# Patient Record
Sex: Male | Born: 1965 | Race: White | Hispanic: No | Marital: Single | Smoking: Current some day smoker
Health system: Southern US, Community
[De-identification: ages and names within clinical notes are randomized; demographics above are authoritative.]

## PROBLEM LIST (undated history)

## (undated) ENCOUNTER — Ambulatory Visit

## (undated) DIAGNOSIS — F1490 Cocaine use, unspecified, uncomplicated: Secondary | ICD-10-CM

## (undated) DIAGNOSIS — I214 Non-ST elevation (NSTEMI) myocardial infarction: Secondary | ICD-10-CM

## (undated) DIAGNOSIS — I255 Ischemic cardiomyopathy: Secondary | ICD-10-CM

## (undated) DIAGNOSIS — Z720 Tobacco use: Secondary | ICD-10-CM

## (undated) DIAGNOSIS — F1491 Cocaine use, unspecified, in remission: Secondary | ICD-10-CM

## (undated) DIAGNOSIS — I2510 Atherosclerotic heart disease of native coronary artery without angina pectoris: Secondary | ICD-10-CM

## (undated) HISTORY — DX: Cocaine use, unspecified, in remission: F14.91

---

## 2020-07-08 ENCOUNTER — Inpatient Hospital Stay (HOSPITAL_COMMUNITY)
Admission: EM | Admit: 2020-07-08 | Discharge: 2020-07-12 | DRG: 247 | Disposition: A | Discharge status: Home / Self Care | Payer: BC Managed Care – PPO | Attending: Cardiovascular Disease | Admitting: Cardiovascular Disease

## 2020-07-08 ENCOUNTER — Emergency Department (HOSPITAL_COMMUNITY): Payer: BC Managed Care – PPO

## 2020-07-08 ENCOUNTER — Encounter (HOSPITAL_COMMUNITY): Payer: Self-pay

## 2020-07-08 ENCOUNTER — Other Ambulatory Visit: Payer: Self-pay

## 2020-07-08 DIAGNOSIS — F1410 Cocaine abuse, uncomplicated: Secondary | ICD-10-CM | POA: Diagnosis present

## 2020-07-08 DIAGNOSIS — Z955 Presence of coronary angioplasty implant and graft: Secondary | ICD-10-CM

## 2020-07-08 DIAGNOSIS — I214 Non-ST elevation (NSTEMI) myocardial infarction: Secondary | ICD-10-CM | POA: Diagnosis not present

## 2020-07-08 DIAGNOSIS — I255 Ischemic cardiomyopathy: Secondary | ICD-10-CM | POA: Diagnosis present

## 2020-07-08 DIAGNOSIS — F17210 Nicotine dependence, cigarettes, uncomplicated: Secondary | ICD-10-CM | POA: Diagnosis present

## 2020-07-08 DIAGNOSIS — Z20822 Contact with and (suspected) exposure to covid-19: Secondary | ICD-10-CM | POA: Diagnosis present

## 2020-07-08 DIAGNOSIS — R079 Chest pain, unspecified: Secondary | ICD-10-CM | POA: Diagnosis not present

## 2020-07-08 DIAGNOSIS — Z720 Tobacco use: Secondary | ICD-10-CM

## 2020-07-08 DIAGNOSIS — N179 Acute kidney failure, unspecified: Secondary | ICD-10-CM | POA: Diagnosis not present

## 2020-07-08 DIAGNOSIS — I2510 Atherosclerotic heart disease of native coronary artery without angina pectoris: Secondary | ICD-10-CM

## 2020-07-08 HISTORY — DX: Atherosclerotic heart disease of native coronary artery without angina pectoris: I25.10

## 2020-07-08 HISTORY — DX: Ischemic cardiomyopathy: I25.5

## 2020-07-08 HISTORY — DX: Cocaine use, unspecified, uncomplicated: F14.90

## 2020-07-08 HISTORY — DX: Non-ST elevation (NSTEMI) myocardial infarction: I21.4

## 2020-07-08 HISTORY — DX: Tobacco use: Z72.0

## 2020-07-08 MED ORDER — NITROGLYCERIN 2 % TD OINT
0.5000 [in_us] | TOPICAL_OINTMENT | Freq: Once | TRANSDERMAL | Status: AC
  Administered 2020-07-09: 0.5 [in_us] via TOPICAL
  Filled 2020-07-08: qty 1

## 2020-07-08 MED ORDER — KETOROLAC TROMETHAMINE 30 MG/ML IJ SOLN
15.0000 mg | Freq: Once | INTRAMUSCULAR | Status: AC
  Administered 2020-07-09: 15 mg via INTRAVENOUS
  Filled 2020-07-08: qty 1

## 2020-07-08 MED ORDER — ASPIRIN 325 MG PO TABS
325.0000 mg | ORAL_TABLET | Freq: Once | ORAL | Status: AC
  Administered 2020-07-09: 325 mg via ORAL
  Filled 2020-07-08: qty 1

## 2020-07-08 NOTE — ED Triage Notes (Signed)
Pt reports chest pain that started this afternoon, pt took a nap, woke up, and chest pain was still there, pt also reports pain in left arm. EKG performed in triage.

## 2020-07-08 NOTE — ED Provider Notes (Addendum)
[facility] EMERGENCY DEPARTMENT Provider Note   CSN: [id number] Arrival date & time: 07/08/20  2144   Time seen 11:44 PM  History Chief Complaint  Patient presents with  . Chest Pain    Billy Briggs is a 54 y.o. male.  HPI   Patient states around 3 PM he was laying on the couch watching a movie and he had acute onset of left-sided chest pain that he initially described as burning but then stated it was not a feeling of feeling hot or on fire but  he could not describe the pain. He took two ibuprofen and took a nap and when he woke up at 8:30 PM the pain was still there. He took two more ibuprofen and his pain has improved but still present. He states laying down makes the pain worse, sitting up makes it feel better. He states when the pain gets intense he gets short of breath but otherwise he is not short of breath. He states the pain has been constant but waxes and wanes. He had nausea and vomited once at 3 PM when the pain first started. He states it does radiate about halfway down his left arm but not into his back or neck. He states he had similar pain about a week ago that only lasted about an hour. He denies any change in his routine or work duties. He denies history of hypertension, diabetes, high cholesterol. He denies any family history of coronary artery disease. Patient does smoke about 1 pack/week. He drinks once or twice a week and states he drinks "a few beers". He states his pain was 10 out of 10 prior to coming to the ED but is now 7 out of 10 after taking ibuprofen.    PCP Patient, No Pcp Per   History reviewed. No pertinent past medical history.  Patient Active Problem List   Diagnosis Date Noted  . NSTEMI (non-ST elevated myocardial infarction) (HCC) 07/09/2020    History reviewed. No pertinent surgical history.     No family history on file.  Social History   Tobacco Use  . Smoking status: Current Some Day Smoker  . Smokeless tobacco: Never Used    Substance Use Topics  . Alcohol use: Yes    Comment: occ  . Drug use: Yes    Comment: crack occ  employed in factory  Home Medications Prior to Admission medications   Not on File  None  Allergies    Patient has no known allergies.  Review of Systems   Review of Systems  All other systems reviewed and are negative.   Physical Exam Updated Vital Signs BP 109/79   Pulse 62   Temp 97.7 F (36.5 C) (Oral)   Resp (!) 25   Ht 6\' 1"  (1.854 m)   Wt 88.5 kg   SpO2 100%   BMI 25.73 kg/m   Physical Exam Vitals and nursing note reviewed.  Constitutional:      General: He is in acute distress.     Appearance: Normal appearance. He is normal weight.  HENT:     Head: Normocephalic and atraumatic.  Eyes:     Extraocular Movements: Extraocular movements intact.     Conjunctiva/sclera: Conjunctivae normal.     Pupils: Pupils are equal, round, and reactive to light.  Cardiovascular:     Rate and Rhythm: Normal rate and regular rhythm.     Pulses: Normal pulses.     Heart sounds: Normal heart sounds. No murmur heard.  Pulmonary:     Effort: Pulmonary effort is normal. No respiratory distress.     Breath sounds: Normal breath sounds. No wheezing.  Chest:     Chest wall: No tenderness.       Comments: Area of chest pain noted Abdominal:     General: Abdomen is flat. Bowel sounds are normal.     Palpations: Abdomen is soft.     Tenderness: There is no abdominal tenderness.  Musculoskeletal:     Cervical back: Normal range of motion.  Skin:    General: Skin is warm and dry.  Neurological:     General: No focal deficit present.     Mental Status: He is alert and oriented to person, place, and time.     Cranial Nerves: No cranial nerve deficit.  Psychiatric:        Mood and Affect: Mood is anxious.        Speech: Speech is rapid and pressured.        Behavior: Behavior normal. Behavior is cooperative.        Thought Content: Thought content normal.     ED Results  / Procedures / Treatments   Labs (all labs ordered are listed, but only abnormal results are displayed) Results for orders placed or performed during the hospital encounter of 07/08/20  SARS Coronavirus 2 by RT PCR (hospital order, performed in [facility] hospital lab) Nasopharyngeal Nasopharyngeal Swab   Specimen: Nasopharyngeal Swab  Result Value Ref Range   SARS Coronavirus 2 NEGATIVE NEGATIVE  Basic metabolic panel  Result Value Ref Range   Sodium 137 135 - 145 mmol/L   Potassium 3.8 3.5 - 5.1 mmol/L   Chloride 107 98 - 111 mmol/L   CO2 19 (L) 22 - 32 mmol/L   Glucose, Bld 131 (H) 70 - 99 mg/dL   BUN 17 6 - 20 mg/dL   Creatinine, Ser 8.58 0.61 - 1.24 mg/dL   Calcium 9.2 8.9 - 85.0 mg/dL   GFR calc non Af Amer >60 >60 mL/min   GFR calc Af Amer >60 >60 mL/min   Anion gap 11 5 - 15  CBC  Result Value Ref Range   WBC 11.2 (H) 4.0 - 10.5 K/uL   RBC 5.91 (H) 4.22 - 5.81 MIL/uL   Hemoglobin 17.1 (H) 13.0 - 17.0 g/dL   HCT 27.7 39 - 52 %   MCV 86.0 80.0 - 100.0 fL   MCH 28.9 26.0 - 34.0 pg   MCHC 33.7 30.0 - 36.0 g/dL   RDW 41.2 87.8 - 67.6 %   Platelets 333 150 - 400 K/uL   nRBC 0.0 0.0 - 0.2 %  D-dimer, quantitative  Result Value Ref Range   D-Dimer, Quant <0.27 0.00 - 0.50 ug/mL-FEU  Hepatic function panel  Result Value Ref Range   Total Protein 6.9 6.5 - 8.1 g/dL   Albumin 4.0 3.5 - 5.0 g/dL   AST 42 (H) 15 - 41 U/L   ALT 27 0 - 44 U/L   Alkaline Phosphatase 85 38 - 126 U/L   Total Bilirubin 1.4 (H) 0.3 - 1.2 mg/dL   Bilirubin, Direct 0.2 0.0 - 0.2 mg/dL   Indirect Bilirubin 1.2 (H) 0.3 - 0.9 mg/dL  Lipase, blood  Result Value Ref Range   Lipase 19 11 - 51 U/L  Ethanol  Result Value Ref Range   Alcohol, Ethyl (B) <10 <10 mg/dL  Urine rapid drug screen (hosp performed)  Result Value Ref Range   Opiates  NONE DETECTED NONE DETECTED   Cocaine POSITIVE (A) NONE DETECTED   Benzodiazepines NONE DETECTED NONE DETECTED   Amphetamines NONE DETECTED NONE DETECTED    Tetrahydrocannabinol NONE DETECTED NONE DETECTED   Barbiturates NONE DETECTED NONE DETECTED  Troponin I (High Sensitivity)  Result Value Ref Range   Troponin I (High Sensitivity) 2,586 (HH) <18 ng/L  Troponin I (High Sensitivity)  Result Value Ref Range   Troponin I (High Sensitivity) 5,359 (HH) <18 ng/L   Laboratory interpretation all normal except positive initial troponin and the delta troponin increased by 3000 roughly.  Positive UDS, mild leukocytosis    EKG EKG Interpretation  Date/Time:  Saturday July 08 2020 21:52:57 EDT Ventricular Rate:  87 PR Interval:  136 QRS Duration: 84 QT Interval:  372 QTC Calculation: 447 R Axis:   87 Text Interpretation: Normal sinus rhythm with sinus arrhythmia Anteroseptal infarct , age undetermined Baseline wander No old tracing to compare Confirmed by Devoria AlbeKnapp, Tere Mcconaughey ([id number]) on 07/08/2020 11:06:58 PM   Radiology DG Chest 2 View  Result Date: 07/08/2020 CLINICAL DATA:  Chest pain EXAM: CHEST - 2 VIEW COMPARISON:  None. FINDINGS: The heart size and mediastinal contours are within normal limits. Both lungs are clear. The visualized skeletal structures are unremarkable. IMPRESSION: No active cardiopulmonary disease. Electronically Signed   By: Helyn NumbersAshesh  Parikh MD   On: 07/08/2020 22:30    Procedures .Critical Care Performed by: Devoria AlbeKnapp, Antjuan Rothe, MD Authorized by: Devoria AlbeKnapp, German Manke, MD   Critical care provider statement:    Critical care time (minutes):  40   Critical care was necessary to treat or prevent imminent or life-threatening deterioration of the following conditions:  Cardiac failure   Critical care was time spent personally by me on the following activities:  Discussions with consultants, examination of patient, obtaining history from patient or surrogate, ordering and review of laboratory studies, ordering and review of radiographic studies, re-evaluation of patient's condition and review of old charts   (including critical care  time)  Medications Ordered in ED Medications  heparin bolus via infusion 4,000 Units (has no administration in time range)  heparin ADULT infusion 100 units/mL (25000 units/28350mL sodium chloride 0.45%) (has no administration in time range)  nitroGLYCERIN 50 mg in dextrose 5 % 250 mL (0.2 mg/mL) infusion (has no administration in time range)  sodium chloride 0.9 % bolus 500 mL (has no administration in time range)  aspirin tablet 325 mg (325 mg Oral Given 07/09/20 0030)  nitroGLYCERIN (NITROGLYN) 2 % ointment 0.5 inch (0.5 inches Topical Given 07/09/20 0030)  ketorolac (TORADOL) 30 MG/ML injection 15 mg (15 mg Intravenous Given 07/09/20 0029)  fentaNYL (SUBLIMAZE) injection 50 mcg (50 mcg Intravenous Given 07/09/20 0105)    ED Course  I have reviewed the triage vital signs and the nursing notes.  Pertinent labs & imaging results that were available during my care of the patient were reviewed by me and considered in my medical decision making (see chart for details).    MDM Rules/Calculators/A&P                           Patient was given aspirin 325 mg, he was given 1/2 inch of nitroglycerin paste because his blood pressure was borderline at 109 systolic.  He also was given ketorolac 15 mg IV since ibuprofen had helped his pain before.  Recheck at 12:40 AM patient's initial troponin is very elevated.  On reviewing his chart he has a history of cocaine  abuse.  When asked patient he states the last time was 3 days ago.  He states he did not do any today.  He states his pain now is a 3-4 out of 10 and is much better.  He was given fentanyl for pain, he was also started on heparin bolus and drip per pharmacy.  I am waiting for cardiology to call me back.  1:07 AM patient discussed with Dr. Mayford Knife, cardiology.  She agrees with heparin drip and putting patient on nitroglycerin drip, I will have nurses remove the nitroglycerin paste.  She is reviewed his EKG and agrees he is not a STEMI.  She has  excepted him for admission at [facility] to telemetry bed when available.  Dr. Elease Hashimoto will be the admitting doctor.  He can have morphine as needed for pain.  She also agrees with the UDS.  Recheck at 3:40 AM patient states his pain is gone and is a 0.  His second troponin is about 3000 units higher.  I asked him to let the nurse know immediately if his pain returns or he gets worse.  5:20 AM patient is transferred to [facility] for admission to cardiology.  Final Clinical Impression(s) / ED Diagnoses Final diagnoses:  NSTEMI (non-ST elevated myocardial infarction) [facility])    Rx / DC Orders  Plan admission  Devoria Albe, MD, Concha Pyo, MD 07/09/20 [id number]    Devoria Albe, MD 07/09/20 [phone]

## 2020-07-09 ENCOUNTER — Encounter (HOSPITAL_COMMUNITY)
Admission: EM | Disposition: A | Discharge status: Home / Self Care | Payer: Self-pay | Source: Home / Self Care | Attending: Cardiovascular Disease

## 2020-07-09 ENCOUNTER — Other Ambulatory Visit: Payer: Self-pay

## 2020-07-09 ENCOUNTER — Encounter (HOSPITAL_COMMUNITY): Payer: Self-pay | Admitting: Cardiovascular Disease

## 2020-07-09 ENCOUNTER — Observation Stay (HOSPITAL_COMMUNITY): Payer: BC Managed Care – PPO

## 2020-07-09 DIAGNOSIS — N183 Chronic kidney disease, stage 3 unspecified: Secondary | ICD-10-CM | POA: Diagnosis not present

## 2020-07-09 DIAGNOSIS — Z720 Tobacco use: Secondary | ICD-10-CM | POA: Diagnosis not present

## 2020-07-09 DIAGNOSIS — I214 Non-ST elevation (NSTEMI) myocardial infarction: Secondary | ICD-10-CM | POA: Diagnosis present

## 2020-07-09 DIAGNOSIS — R079 Chest pain, unspecified: Secondary | ICD-10-CM

## 2020-07-09 DIAGNOSIS — F17210 Nicotine dependence, cigarettes, uncomplicated: Secondary | ICD-10-CM | POA: Diagnosis present

## 2020-07-09 DIAGNOSIS — R778 Other specified abnormalities of plasma proteins: Secondary | ICD-10-CM

## 2020-07-09 DIAGNOSIS — F1410 Cocaine abuse, uncomplicated: Secondary | ICD-10-CM | POA: Diagnosis present

## 2020-07-09 DIAGNOSIS — I255 Ischemic cardiomyopathy: Secondary | ICD-10-CM | POA: Diagnosis present

## 2020-07-09 DIAGNOSIS — N289 Disorder of kidney and ureter, unspecified: Secondary | ICD-10-CM | POA: Diagnosis not present

## 2020-07-09 DIAGNOSIS — N179 Acute kidney failure, unspecified: Secondary | ICD-10-CM | POA: Diagnosis not present

## 2020-07-09 DIAGNOSIS — Z20822 Contact with and (suspected) exposure to covid-19: Secondary | ICD-10-CM | POA: Diagnosis present

## 2020-07-09 DIAGNOSIS — I2510 Atherosclerotic heart disease of native coronary artery without angina pectoris: Secondary | ICD-10-CM | POA: Diagnosis present

## 2020-07-09 HISTORY — PX: LEFT HEART CATH AND CORONARY ANGIOGRAPHY: CATH118249

## 2020-07-09 LAB — HEPARIN LEVEL (UNFRACTIONATED): Heparin Unfractionated: 0.4 IU/mL (ref 0.30–0.70)

## 2020-07-09 LAB — CBC
HCT: 41.7 % (ref 39.0–52.0)
HCT: 50.8 % (ref 39.0–52.0)
Hemoglobin: 13.6 g/dL (ref 13.0–17.0)
Hemoglobin: 17.1 g/dL — ABNORMAL HIGH (ref 13.0–17.0)
MCH: 28.2 pg (ref 26.0–34.0)
MCH: 28.9 pg (ref 26.0–34.0)
MCHC: 32.6 g/dL (ref 30.0–36.0)
MCHC: 33.7 g/dL (ref 30.0–36.0)
MCV: 86 fL (ref 80.0–100.0)
MCV: 86.3 fL (ref 80.0–100.0)
Platelets: 255 10*3/uL (ref 150–400)
Platelets: 333 10*3/uL (ref 150–400)
RBC: 4.83 MIL/uL (ref 4.22–5.81)
RBC: 5.91 MIL/uL — ABNORMAL HIGH (ref 4.22–5.81)
RDW: 13.8 % (ref 11.5–15.5)
RDW: 13.9 % (ref 11.5–15.5)
WBC: 11.2 10*3/uL — ABNORMAL HIGH (ref 4.0–10.5)
WBC: 6.8 10*3/uL (ref 4.0–10.5)
nRBC: 0 % (ref 0.0–0.2)
nRBC: 0 % (ref 0.0–0.2)

## 2020-07-09 LAB — LIPID PANEL
Cholesterol: 117 mg/dL (ref 0–200)
HDL: 44 mg/dL (ref >40)
LDL Cholesterol: 53 mg/dL (ref 0–99)
Total CHOL/HDL Ratio: 2.7 RATIO
Triglycerides: 99 mg/dL (ref <150)
VLDL: 20 mg/dL (ref 0–40)

## 2020-07-09 LAB — BASIC METABOLIC PANEL
Anion gap: 11 (ref 5–15)
BUN: 17 mg/dL (ref 6–20)
CO2: 19 mmol/L — ABNORMAL LOW (ref 22–32)
Calcium: 9.2 mg/dL (ref 8.9–10.3)
Chloride: 107 mmol/L (ref 98–111)
Creatinine, Ser: 1.14 mg/dL (ref 0.61–1.24)
GFR calc Af Amer: >60 mL/min (ref >60)
GFR calc non Af Amer: >60 mL/min (ref >60)
Glucose, Bld: 131 mg/dL — ABNORMAL HIGH (ref 70–99)
Potassium: 3.8 mmol/L (ref 3.5–5.1)
Sodium: 137 mmol/L (ref 135–145)

## 2020-07-09 LAB — RAPID URINE DRUG SCREEN, HOSP PERFORMED
Amphetamines: NOT DETECTED
Barbiturates: NOT DETECTED
Benzodiazepines: NOT DETECTED
Cocaine: POSITIVE — AB
Opiates: NOT DETECTED
Tetrahydrocannabinol: NOT DETECTED

## 2020-07-09 LAB — SEDIMENTATION RATE: Sed Rate: 0 mm/hr (ref 0–16)

## 2020-07-09 LAB — D-DIMER, QUANTITATIVE: D-Dimer, Quant: <0.27 ug/mL-FEU (ref 0.00–0.50)

## 2020-07-09 LAB — HEMOGLOBIN A1C
Hgb A1c MFr Bld: 5.4 % (ref 4.8–5.6)
Mean Plasma Glucose: 108.28 mg/dL

## 2020-07-09 LAB — ETHANOL: Alcohol, Ethyl (B): <10 mg/dL (ref <10)

## 2020-07-09 LAB — ECHOCARDIOGRAM COMPLETE
Area-P 1/2: 2.76 cm2
Height: 73 in
S' Lateral: 4.1 cm
Weight: 3104.08 oz

## 2020-07-09 LAB — HEPATIC FUNCTION PANEL
ALT: 27 U/L (ref 0–44)
AST: 42 U/L — ABNORMAL HIGH (ref 15–41)
Albumin: 4 g/dL (ref 3.5–5.0)
Alkaline Phosphatase: 85 U/L (ref 38–126)
Bilirubin, Direct: 0.2 mg/dL (ref 0.0–0.2)
Indirect Bilirubin: 1.2 mg/dL — ABNORMAL HIGH (ref 0.3–0.9)
Total Bilirubin: 1.4 mg/dL — ABNORMAL HIGH (ref 0.3–1.2)
Total Protein: 6.9 g/dL (ref 6.5–8.1)

## 2020-07-09 LAB — TROPONIN I (HIGH SENSITIVITY)
Troponin I (High Sensitivity): 14964 ng/L (ref <18)
Troponin I (High Sensitivity): 2586 ng/L (ref <18)
Troponin I (High Sensitivity): 5359 ng/L (ref <18)

## 2020-07-09 LAB — LIPASE, BLOOD: Lipase: 19 U/L (ref 11–51)

## 2020-07-09 LAB — TSH: TSH: 1.844 u[IU]/mL (ref 0.350–4.500)

## 2020-07-09 LAB — MRSA PCR SCREENING: MRSA by PCR: NEGATIVE

## 2020-07-09 LAB — HIV ANTIBODY (ROUTINE TESTING W REFLEX): HIV Screen 4th Generation wRfx: NONREACTIVE

## 2020-07-09 LAB — SARS CORONAVIRUS 2 BY RT PCR (HOSPITAL ORDER, PERFORMED IN ~~LOC~~ HOSPITAL LAB): SARS Coronavirus 2: NEGATIVE

## 2020-07-09 SURGERY — LEFT HEART CATH AND CORONARY ANGIOGRAPHY
Anesthesia: LOCAL

## 2020-07-09 MED ORDER — NITROGLYCERIN 0.4 MG SL SUBL: 0.4000 mg | SUBLINGUAL_TABLET | SUBLINGUAL | Status: DC | PRN

## 2020-07-09 MED ORDER — NITROGLYCERIN IN D5W 200-5 MCG/ML-% IV SOLN: 0.0000 ug/min | INTRAVENOUS | Status: DC

## 2020-07-09 MED ORDER — VERAPAMIL HCL 2.5 MG/ML IV SOLN
INTRAVENOUS | Status: AC
  Filled 2020-07-09: qty 2

## 2020-07-09 MED ORDER — HEPARIN SODIUM (PORCINE) 1000 UNIT/ML IJ SOLN
INTRAMUSCULAR | Status: AC
  Filled 2020-07-09: qty 1

## 2020-07-09 MED ORDER — SODIUM CHLORIDE 0.9% FLUSH
3.0000 mL | Freq: Two times a day (BID) | INTRAVENOUS | Status: DC
  Administered 2020-07-09 – 2020-07-10 (×2): 3 mL via INTRAVENOUS

## 2020-07-09 MED ORDER — DIAZEPAM 5 MG PO TABS
5.0000 mg | ORAL_TABLET | ORAL | Status: DC | PRN
  Administered 2020-07-10: 5 mg via ORAL
  Filled 2020-07-09: qty 1

## 2020-07-09 MED ORDER — ONDANSETRON HCL 4 MG/2ML IJ SOLN: 4.0000 mg | Freq: Four times a day (QID) | INTRAMUSCULAR | Status: DC | PRN

## 2020-07-09 MED ORDER — ARGATROBAN 50 MG/50ML IV SOLN
0.5000 ug/kg/min | INTRAVENOUS | Status: DC
  Administered 2020-07-09: 0.5 ug/kg/min via INTRAVENOUS
  Filled 2020-07-09: qty 50

## 2020-07-09 MED ORDER — IOHEXOL 350 MG/ML SOLN
INTRAVENOUS | Status: DC | PRN
  Administered 2020-07-09: 60 mL via INTRA_ARTERIAL

## 2020-07-09 MED ORDER — ACETAMINOPHEN 325 MG PO TABS: 650.0000 mg | ORAL_TABLET | ORAL | Status: DC | PRN

## 2020-07-09 MED ORDER — SODIUM CHLORIDE 0.9 % WEIGHT BASED INFUSION
1.0000 mL/kg/h | INTRAVENOUS | Status: AC
  Administered 2020-07-09 (×2): 1 mL/kg/h via INTRAVENOUS

## 2020-07-09 MED ORDER — HEPARIN BOLUS VIA INFUSION
4000.0000 [IU] | Freq: Once | INTRAVENOUS | Status: AC
  Administered 2020-07-09: 4000 [IU] via INTRAVENOUS

## 2020-07-09 MED ORDER — HEPARIN (PORCINE) 25000 UT/250ML-% IV SOLN
1450.0000 [IU]/h | INTRAVENOUS | Status: DC
  Administered 2020-07-09: 900 [IU]/h via INTRAVENOUS
  Administered 2020-07-10: 1300 [IU]/h via INTRAVENOUS
  Administered 2020-07-11: 1450 [IU]/h via INTRAVENOUS
  Filled 2020-07-09 (×3): qty 250

## 2020-07-09 MED ORDER — SODIUM CHLORIDE 0.9% FLUSH
3.0000 mL | Freq: Two times a day (BID) | INTRAVENOUS | Status: DC
  Administered 2020-07-09: 3 mL via INTRAVENOUS

## 2020-07-09 MED ORDER — ATORVASTATIN CALCIUM 80 MG PO TABS
80.0000 mg | ORAL_TABLET | Freq: Every day | ORAL | Status: DC
  Administered 2020-07-09 – 2020-07-10 (×2): 80 mg via ORAL
  Filled 2020-07-09 (×2): qty 1

## 2020-07-09 MED ORDER — CHLORHEXIDINE GLUCONATE CLOTH 2 % EX PADS
6.0000 | MEDICATED_PAD | Freq: Every day | CUTANEOUS | Status: DC
  Administered 2020-07-09 – 2020-07-11 (×3): 6 via TOPICAL

## 2020-07-09 MED ORDER — SODIUM CHLORIDE 0.9 % IV BOLUS
500.0000 mL | Freq: Once | INTRAVENOUS | Status: AC
  Administered 2020-07-09: 500 mL via INTRAVENOUS

## 2020-07-09 MED ORDER — SODIUM CHLORIDE 0.9 % IV SOLN: 250.0000 mL | INTRAVENOUS | Status: DC | PRN

## 2020-07-09 MED ORDER — ASPIRIN 81 MG PO CHEW: 81.0000 mg | CHEWABLE_TABLET | ORAL | Status: DC

## 2020-07-09 MED ORDER — NITROGLYCERIN 2 % TD OINT
1.0000 [in_us] | TOPICAL_OINTMENT | Freq: Four times a day (QID) | TRANSDERMAL | Status: DC
  Filled 2020-07-09: qty 30

## 2020-07-09 MED ORDER — SODIUM CHLORIDE 0.9 % WEIGHT BASED INFUSION
3.0000 mL/kg/h | INTRAVENOUS | Status: DC
  Administered 2020-07-09: 3 mL/kg/h via INTRAVENOUS

## 2020-07-09 MED ORDER — TIROFIBAN HCL IN NACL 5-0.9 MG/100ML-% IV SOLN
INTRAVENOUS | Status: DC | PRN
  Administered 2020-07-09: 0.15 ug/kg/min via INTRAVENOUS

## 2020-07-09 MED ORDER — TIROFIBAN (AGGRASTAT) BOLUS VIA INFUSION
INTRAVENOUS | Status: DC | PRN
  Administered 2020-07-09: 2212.5 ug via INTRAVENOUS

## 2020-07-09 MED ORDER — HEPARIN (PORCINE) 25000 UT/250ML-% IV SOLN
1200.0000 [IU]/h | INTRAVENOUS | Status: DC
  Administered 2020-07-09: 1200 [IU]/h via INTRAVENOUS
  Filled 2020-07-09: qty 250

## 2020-07-09 MED ORDER — SODIUM CHLORIDE 0.9% FLUSH: 3.0000 mL | INTRAVENOUS | Status: DC | PRN

## 2020-07-09 MED ORDER — NITROGLYCERIN IN D5W 200-5 MCG/ML-% IV SOLN
5.0000 ug/min | INTRAVENOUS | Status: DC
  Filled 2020-07-09: qty 250

## 2020-07-09 MED ORDER — VERAPAMIL HCL 2.5 MG/ML IV SOLN
INTRAVENOUS | Status: DC | PRN
  Administered 2020-07-09: 10 mL via INTRA_ARTERIAL

## 2020-07-09 MED ORDER — ATORVASTATIN CALCIUM 80 MG PO TABS: 80.0000 mg | ORAL_TABLET | Freq: Every day | ORAL | Status: DC

## 2020-07-09 MED ORDER — HEPARIN SODIUM (PORCINE) 1000 UNIT/ML IJ SOLN
INTRAMUSCULAR | Status: DC | PRN
  Administered 2020-07-09: 4500 [IU] via INTRAVENOUS

## 2020-07-09 MED ORDER — FENTANYL CITRATE (PF) 100 MCG/2ML IJ SOLN
50.0000 ug | Freq: Once | INTRAMUSCULAR | Status: AC
  Administered 2020-07-09: 50 ug via INTRAVENOUS
  Filled 2020-07-09: qty 2

## 2020-07-09 MED ORDER — HYDRALAZINE HCL 20 MG/ML IJ SOLN: 10.0000 mg | INTRAMUSCULAR | Status: AC | PRN

## 2020-07-09 MED ORDER — MIDAZOLAM HCL 2 MG/2ML IJ SOLN
INTRAMUSCULAR | Status: DC | PRN
  Administered 2020-07-09: 1 mg via INTRAVENOUS

## 2020-07-09 MED ORDER — ASPIRIN 81 MG PO CHEW: 324.0000 mg | CHEWABLE_TABLET | ORAL | Status: AC

## 2020-07-09 MED ORDER — IOHEXOL 350 MG/ML SOLN
INTRAVENOUS | Status: AC
  Filled 2020-07-09: qty 1

## 2020-07-09 MED ORDER — TIROFIBAN HCL IN NACL 5-0.9 MG/100ML-% IV SOLN
0.1500 ug/kg/min | INTRAVENOUS | Status: DC
  Administered 2020-07-09 – 2020-07-11 (×7): 0.15 ug/kg/min via INTRAVENOUS
  Filled 2020-07-09 (×5): qty 100
  Filled 2020-07-09: qty 200
  Filled 2020-07-09: qty 100

## 2020-07-09 MED ORDER — MIDAZOLAM HCL 2 MG/2ML IJ SOLN
INTRAMUSCULAR | Status: AC
  Filled 2020-07-09: qty 2

## 2020-07-09 MED ORDER — ASPIRIN EC 81 MG PO TBEC: 81.0000 mg | DELAYED_RELEASE_TABLET | Freq: Every day | ORAL | Status: DC

## 2020-07-09 MED ORDER — TIROFIBAN HCL IN NACL 5-0.9 MG/100ML-% IV SOLN
INTRAVENOUS | Status: AC
  Filled 2020-07-09: qty 100

## 2020-07-09 MED ORDER — ASPIRIN 300 MG RE SUPP: 300.0000 mg | RECTAL | Status: AC

## 2020-07-09 MED ORDER — PERFLUTREN LIPID MICROSPHERE
INTRAVENOUS | Status: AC
  Filled 2020-07-09: qty 10

## 2020-07-09 MED ORDER — SODIUM CHLORIDE 0.9 % WEIGHT BASED INFUSION: 1.0000 mL/kg/h | INTRAVENOUS | Status: DC

## 2020-07-09 MED ORDER — HEPARIN (PORCINE) IN NACL 1000-0.9 UT/500ML-% IV SOLN
INTRAVENOUS | Status: AC
  Filled 2020-07-09: qty 1000

## 2020-07-09 MED ORDER — FENTANYL CITRATE (PF) 100 MCG/2ML IJ SOLN
INTRAMUSCULAR | Status: DC | PRN
  Administered 2020-07-09: 25 ug via INTRAVENOUS

## 2020-07-09 MED ORDER — LIDOCAINE HCL (PF) 1 % IJ SOLN
INTRAMUSCULAR | Status: AC
  Filled 2020-07-09: qty 30

## 2020-07-09 MED ORDER — PERFLUTREN LIPID MICROSPHERE
1.0000 mL | INTRAVENOUS | Status: AC | PRN
  Administered 2020-07-09: 4 mL via INTRAVENOUS
  Filled 2020-07-09: qty 10

## 2020-07-09 MED ORDER — LABETALOL HCL 5 MG/ML IV SOLN: 10.0000 mg | INTRAVENOUS | Status: AC | PRN

## 2020-07-09 MED ORDER — LIDOCAINE HCL (PF) 1 % IJ SOLN
INTRAMUSCULAR | Status: DC | PRN
  Administered 2020-07-09: 5 mL via SUBCUTANEOUS

## 2020-07-09 MED ORDER — ASPIRIN 81 MG PO CHEW
81.0000 mg | CHEWABLE_TABLET | Freq: Every day | ORAL | Status: DC
  Administered 2020-07-10: 81 mg via ORAL
  Filled 2020-07-09: qty 1

## 2020-07-09 MED ORDER — FENTANYL CITRATE (PF) 100 MCG/2ML IJ SOLN
INTRAMUSCULAR | Status: AC
  Filled 2020-07-09: qty 2

## 2020-07-09 SURGICAL SUPPLY — 13 items
CATH INFINITI JR4 5F (CATHETERS) ×1 IMPLANT
CATH OPTITORQUE TIG 4.0 5F (CATHETERS) ×1 IMPLANT
DEVICE RAD COMP TR BAND LRG (VASCULAR PRODUCTS) ×1 IMPLANT
ELECT DEFIB PAD ADLT CADENCE (PAD) ×1 IMPLANT
GLIDESHEATH SLEND SS 6F .021 (SHEATH) ×1 IMPLANT
GUIDEWIRE INQWIRE 1.5J.035X260 (WIRE) IMPLANT
INQWIRE 1.5J .035X260CM (WIRE) ×2
KIT HEART LEFT (KITS) ×2 IMPLANT
PACK CARDIAC CATHETERIZATION (CUSTOM PROCEDURE TRAY) ×2 IMPLANT
SHEATH PROBE COVER 6X72 (BAG) ×1 IMPLANT
SYR MEDRAD MARK 7 150ML (SYRINGE) ×2 IMPLANT
TRANSDUCER W/STOPCOCK (MISCELLANEOUS) ×2 IMPLANT
TUBING CIL FLEX 10 FLL-RA (TUBING) ×2 IMPLANT

## 2020-07-09 NOTE — Interval H&P Note (Signed)
Cath Lab Visit (complete for each Cath Lab visit)  Clinical Evaluation Leading to the Procedure:   ACS: Yes.    Non-ACS:    Anginal Classification: CCS IV  Anti-ischemic medical therapy: Minimal Therapy (1 class of medications)  Non-Invasive Test Results: No non-invasive testing performed  Prior CABG: No previous CABG      History and Physical Interval Note:  07/09/2020 11:08 AM  Billy Briggs  has presented today for surgery, with the diagnosis of STEMI.  The various methods of treatment have been discussed with the patient and family. After consideration of risks, benefits and other options for treatment, the patient has consented to  Procedure(s): LEFT HEART CATH AND CORONARY ANGIOGRAPHY (N/A) as a surgical intervention.  The patient's history has been reviewed, patient examined, no change in status, stable for surgery.  I have reviewed the patient's chart and labs.  Questions were answered to the patient's satisfaction.     Nicki Guadalajara

## 2020-07-09 NOTE — Progress Notes (Signed)
°  Echocardiogram 2D Echocardiogram was attempted but the patient is going to the cath lab. We will try again later today.   Billy Briggs 07/09/2020, 8:01 AM

## 2020-07-09 NOTE — Progress Notes (Signed)
ANTICOAGULATION CONSULT NOTE - Initial Consult  Pharmacy Consult for heparin Indication: chest pain/ACS  No Known Allergies  Patient Measurements: Height: 6\' 1"  (185.4 cm) Weight: 88.5 kg (195 lb) IBW/kg (Calculated) : 79.9 Heparin Dosing Weight: 88.5 kg  Vital Signs: Temp: 97.7 F (36.5 C) (09/04 2158) Temp Source: Oral (09/04 2158) BP: 109/79 (09/04 2339) Pulse Rate: 62 (09/04 2339)  Labs: Recent Labs    07/08/20 2347  HGB 17.1*  HCT 50.8  PLT 333  CREATININE 1.14  TROPONINIHS 2,586*    Estimated Creatinine Clearance: 83.7 mL/min (by C-G formula based on SCr of 1.14 mg/dL).   Medical History: History reviewed. No pertinent past medical history.  Medications:  See medication history  Assessment: 54 yo man to start heparin for CP.  Hg 17.1, PTLC 333.  He was n ot on anticoagulation PTA Goal of Therapy:  Heparin level 0.3-0.7 units/ml Monitor platelets by anticoagulation protocol: Yes   Plan:  Heparin 4000 unit bolus and drip at 1200 units/hr Check heparin level 6 hours after start Daily HL and CBC while on heparin Monitor for bleeding complications  Billy Briggs 07/09/2020,1:07 AM

## 2020-07-09 NOTE — Progress Notes (Signed)
   Cardiac catheterization was discussed with the patient fully. The patient understands that risks include but are not limited to stroke (1 in 1000), death (1 in 1000), kidney failure [usually temporary] (1 in 500), bleeding (1 in 200), allergic reaction [possibly serious] (1 in 200).  The patient understands and is willing to proceed.     Ailyne Pawley, PA-C 07/09/2020 8:33 AM   

## 2020-07-09 NOTE — Progress Notes (Signed)
ANTICOAGULATION CONSULT NOTE  Pharmacy Consult for IV heparin Indication: chest pain/ACS  No Known Allergies  Patient Measurements: Height: 6\' 1"  (185.4 cm) Weight: 88.5 kg (195 lb) IBW/kg (Calculated) : 79.9 Heparin Dosing Weight: 88.5  Vital Signs: Temp: 97.9 F (36.6 C) (09/05 0752) Temp Source: Oral (09/05 0752) BP: 109/72 (09/05 0752) Pulse Rate: 70 (09/05 0752)  Labs: Recent Labs    07/08/20 2347 07/09/20 0125 07/09/20 0751  HGB 17.1*  --   --   HCT 50.8  --   --   PLT 333  --   --   HEPARINUNFRC  --   --  0.40  CREATININE 1.14  --   --   TROPONINIHS 2,586* 5,359*  --     Estimated Creatinine Clearance: 83.7 mL/min (by C-G formula based on SCr of 1.14 mg/dL).   Medications:  Scheduled:  . aspirin  324 mg Oral NOW   Or  . aspirin  300 mg Rectal NOW  . aspirin  81 mg Oral Pre-Cath  . [START ON 07/10/2020] aspirin EC  81 mg Oral Daily  . atorvastatin  80 mg Oral q1800  . nitroGLYCERIN  1 inch Topical Q6H  . sodium chloride flush  3 mL Intravenous Q12H    Assessment: 54 year old male with no PMH other than hx of tobacco use admitted for NSTEMI. Pharmacy consulted to dose IV heparin.  No anticoagulants PTA.  Heparin level 6 hours after starting heparin is therapeutic. Baseline CBC wnl. No reported bleeding.  Goal of Therapy:  Heparin level 0.3-0.7 units/ml Monitor platelets by anticoagulation protocol: Yes   Plan:  Continue IV heparin 1200 units/hr Check 6 hour confirmatory heparin level Check daily heparin level and CBC Monitor for signs and symptoms of bleeding.  57, PharmD PGY-1 Pharmacy Resident 07/09/2020 8:33 AM Please see AMION for all pharmacy numbers

## 2020-07-09 NOTE — H&P (Addendum)
Cardiology Admission History and Physical:   Patient ID: Billy Briggs MRN: [id number]; DOB: [birth year]   Admission date: 07/08/2020  Primary Care Provider: Patient, No Pcp Per Primary Cardiologist: None (NEW) Primary Electrophysiologist:  None   Chief Complaint:  NSTEMI  Patient Profile:   Billy Briggs is a 54 y.o. male with no prior medical hx presented to [facility] ER with complaints of acute onset of left sided Chest burning around 3pm and presented to ER around 9:30pm after taking a nap and waking back up with CP.  History of Present Illness:   [name] is a 54yo male with no PMH but a hx of tobacco use who was in his USOH until 3pm yesterday when he developed acute left sided chest burning while laying on the couch watching a movie.  The pain radiated into his left arm.  He took 2 Ibuprofen and took a nap and when he woke up at 8:30pm it was still there and he took 2 more Ibuprofen and his pain improved.  The pain is worse in the supine position and sitting up makes it feel better.  He does have some DOE when the CP gets bad.  It has been constant for 15 hours with waxing and waning.  He did get sick to his stomach when it first started and vomited.  He had a similar episode of CP about a week ago but only lasted an hour.  He has no fm hx of CAD and no other medical hx.  He smokes cigarettes 1 pack per week and has occasional beer.  In ER hsTrop was elevated at [phone].  WBC elevated at 11.2, DDimer normal, SCr 1.14.  COVID 19 neg.  He is now transferred to [facility] for further evaluation.  Currently he is pain free.  History reviewed. No pertinent past medical history.  History reviewed. No pertinent surgical history.   Medications Prior to Admission: Prior to Admission medications   Not on File     Allergies:   No Known Allergies  Social History:   Social History   Socioeconomic History  . Marital status: Single    Spouse name: Not on file  . Number of children: Not on file    . Years of education: Not on file  . Highest education level: Not on file  Occupational History  . Not on file  Tobacco Use  . Smoking status: Current Some Day Smoker  . Smokeless tobacco: Never Used  Substance and Sexual Activity  . Alcohol use: Yes    Comment: occ  . Drug use: Yes    Comment: crack occ  . Sexual activity: Not on file  Other Topics Concern  . Not on file  Social History Narrative  . Not on file   Social Determinants of Health   Financial Resource Strain:   . Difficulty of Paying Living Expenses: Not on file  Food Insecurity:   . Worried About Programme researcher, broadcasting/film/video in the Last Year: Not on file  . Ran Out of Food in the Last Year: Not on file  Transportation Needs:   . Lack of Transportation (Medical): Not on file  . Lack of Transportation (Non-Medical): Not on file  Physical Activity:   . Days of Exercise per Week: Not on file  . Minutes of Exercise per Session: Not on file  Stress:   . Feeling of Stress : Not on file  Social Connections:   . Frequency of Communication with Friends and Family: Not on  file  . Frequency of Social Gatherings with Friends and Family: Not on file  . Attends Religious Services: Not on file  . Active Member of Clubs or Organizations: Not on file  . Attends Banker Meetings: Not on file  . Marital Status: Not on file  Intimate Partner Violence:   . Fear of Current or Ex-Partner: Not on file  . Emotionally Abused: Not on file  . Physically Abused: Not on file  . Sexually Abused: Not on file    Family History:   The patient's family history was reviewed and no hx of CAD, CHF  ROS:  Please see the history of present illness.  All other ROS reviewed and negative.     Physical Exam/Data:   Vitals:   07/09/20 0300 07/09/20 0330 07/09/20 0430 07/09/20 0603  BP: 109/75 105/74 105/76 100/70  Pulse:   64 69  Resp: (!) 25 (!) 22 (!) 25 16  Temp:    97.8 F (36.6 C)  TempSrc:    Oral  SpO2:   99% 100%   Weight:      Height:        Intake/Output Summary (Last 24 hours) at 07/09/2020 0705 Last data filed at 07/09/2020 0155 Gross per 24 hour  Intake 0 ml  Output --  Net 0 ml   Last 3 Weights 07/08/2020  Weight (lbs) 195 lb  Weight (kg) 88.451 kg     Body mass index is 25.73 kg/m.  General:  Well nourished, well developed, in no acute distress HEENT: normal Lymph: no adenopathy Neck: no JVD Endocrine:  No thryomegaly Vascular: No carotid bruits; FA pulses 2+ bilaterally without bruits  Cardiac:  normal S1, S2; RRR; no murmur  Lungs:  clear to auscultation bilaterally, no wheezing, rhonchi or rales  Abd: soft, nontender, no hepatomegaly  Ext: no edema Musculoskeletal:  No deformities, BUE and BLE strength normal and equal Skin: warm and dry  Neuro:  CNs 2-12 intact, no focal abnormalities noted Psych:  Normal affect    EKG:  The ECG that was done in ER was personally reviewed and demonstrates NSR with anterior infarct and diffuse nonspecific ST/T wave abnormality  Relevant CV Studies: none  Laboratory Data:  High Sensitivity Troponin:   Recent Labs  Lab 07/08/20 2347 07/09/20 0125  TROPONINIHS 2,586* 5,359*      Chemistry Recent Labs  Lab 07/08/20 2347  NA 137  K 3.8  CL 107  CO2 19*  GLUCOSE 131*  BUN 17  CREATININE 1.14  CALCIUM 9.2  GFRNONAA >60  GFRAA >60  ANIONGAP 11    Recent Labs  Lab 07/08/20 2347  PROT 6.9  ALBUMIN 4.0  AST 42*  ALT 27  ALKPHOS 85  BILITOT 1.4*   Hematology Recent Labs  Lab 07/08/20 2347  WBC 11.2*  RBC 5.91*  HGB 17.1*  HCT 50.8  MCV 86.0  MCH 28.9  MCHC 33.7  RDW 13.8  PLT 333   BNPNo results for input(s): BNP, PROBNP in the last 168 hours.  DDimer  Recent Labs  Lab 07/08/20 2347  DDIMER <0.27     Radiology/Studies:  DG Chest 2 View  Result Date: 07/08/2020 CLINICAL DATA:  Chest pain EXAM: CHEST - 2 VIEW COMPARISON:  None. FINDINGS: The heart size and mediastinal contours are within normal limits.  Both lungs are clear. The visualized skeletal structures are unremarkable. IMPRESSION: No active cardiopulmonary disease. Electronically Signed   By: Helyn Numbers MD   On: 07/08/2020  22:30       HEAR Score (for undifferentiated chest pain):  HEAR Score: 4    Assessment and Plan:   1. Chest pain -pain is somewhat atypical in that it started while lying on the couch and worse with laying supine and improved with sitting upright.  -He has no CRFs except for smoking -EKG shows diffuse ST/T wave abnormality and anterior infarct -hsTrop is elevated and trending upward [phone]) -his sx are somewhat worrisome for acute myopericarditis given the worsening of symptoms in the supine position and improved with sitting up -admit to tele bed -cycle hsTrop -check 2D echo stat for LVF and rule out pericardial effusion -check hsCRP, sed rate -continue IV Heparin gtt for now -start high dose statin -no BB due to soft BP -ASA 81mg  daily -he is now pain free>>will discuss with Interventional Card if they want to take to lab today since this is a holiday weekend and would have to be here at least through Tuesday if we waited -if cath shows normal cors then get cardiac MRI to assess for myocarditis  2.  Tobacco abuse -recommend tobacco cessation counseling per nursing   Severity of Illness: The appropriate patient status for this patient is OBSERVATION. Observation status is judged to be reasonable and necessary in order to provide the required intensity of service to ensure the patient's safety. The patient's presenting symptoms, physical exam findings, and initial radiographic and laboratory data in the context of their medical condition is felt to place them at decreased risk for further clinical deterioration. Furthermore, it is anticipated that the patient will be medically stable for discharge from the hospital within 2 midnights of admission. The following factors support the patient status of  observation.   " The patient's presenting symptoms include chest pain. " The physical exam findings include none. " The initial radiographic and laboratory data are abnormal EKG and hsTrop.     For questions or updates, please contact [facility] Please consult [email or web address] for contact info under        Signed, Thursday, MD  07/09/2020 7:05 AM

## 2020-07-09 NOTE — Progress Notes (Signed)
Critical Result called per lab - Troponin = 14,964. Theodore Demark, PA notified.

## 2020-07-09 NOTE — ED Notes (Signed)
CRITICAL VALUE ALERT  Critical Value:  Troponin 2586 Date & Time Notied: 07/09/20 @ 0031 Provider Notified: Lars Mage, MD Orders Received/Actions taken: None yet

## 2020-07-09 NOTE — H&P (View-Only) (Signed)
   Cardiac catheterization was discussed with the patient fully. The patient understands that risks include but are not limited to stroke (1 in 1000), death (1 in 1000), kidney failure [usually temporary] (1 in 500), bleeding (1 in 200), allergic reaction [possibly serious] (1 in 200).  The patient understands and is willing to proceed.     Isam Unrein, PA-C 07/09/2020 8:33 AM   

## 2020-07-09 NOTE — Interval H&P Note (Signed)
History and Physical Interval Note:  07/09/2020 11:08 AM  [name]  has presented today for surgery, with the diagnosis of STEMI.  The various methods of treatment have been discussed with the patient and family. After consideration of risks, benefits and other options for treatment, the patient has consented to  Procedure(s): LEFT HEART CATH AND CORONARY ANGIOGRAPHY (N/A) as a surgical intervention.  The patient's history has been reviewed, patient examined, no change in status, stable for surgery.  I have reviewed the patient's chart and labs.  Questions were answered to the patient's satisfaction.     Billy Briggs

## 2020-07-09 NOTE — Progress Notes (Signed)
CRITICAL VALUE ALERT  Critical Value:  Troponin 5359  Date & Time Notied:  07/09/20 @ 0253  Provider Notified: Dr. Mayford Knife   Orders Received/Actions taken: no new orders at this time

## 2020-07-09 NOTE — Progress Notes (Signed)
ANTICOAGULATION CONSULT NOTE  Pharmacy Consult for IV heparin Indication: chest pain/ACS  No Known Allergies  Patient Measurements: Height: 6\' 1"  (185.4 cm) Weight: 88 kg (194 lb 0.1 oz) IBW/kg (Calculated) : 79.9 Heparin Dosing Weight: 88.5  Vital Signs: Temp: 97.8 F (36.6 C) (09/05 1300) Temp Source: Oral (09/05 0752) BP: 96/70 (09/05 1330) Pulse Rate: 65 (09/05 1330)  Labs: Recent Labs    07/08/20 2347 07/09/20 0125 07/09/20 0751  HGB 17.1*  --   --   HCT 50.8  --   --   PLT 333  --   --   HEPARINUNFRC  --   --  0.40  CREATININE 1.14  --   --   TROPONINIHS 2,586* 5,359* 14,964*    Estimated Creatinine Clearance: 83.7 mL/min (by C-G formula based on SCr of 1.14 mg/dL).   Medications:  Scheduled:  . aspirin  324 mg Oral NOW   Or  . aspirin  300 mg Rectal NOW  . [START ON 07/10/2020] aspirin  81 mg Oral Daily  . atorvastatin  80 mg Oral q1800  . Chlorhexidine Gluconate Cloth  6 each Topical Daily  . sodium chloride flush  3 mL Intravenous Q12H    Assessment: 54 year old male with no PMH other than hx of tobacco use (no AC PTA) admitted for NSTEMI. Pt now s/p LHC this AM. Pharmacy asked to dose Aggrastat for 48 hours and restart heparin infusion 8-hrs after sheath pull. Sheath was pulled around 1200 today. Hgb 17, plts wnl yesterday evening. No overt bleeding noted.  Goal of Therapy:  Heparin level 0.3-0.5 units/ml Monitor platelets by anticoagulation protocol: Yes   Plan:  Continue Aggrastat (tirofiban) 0.15 mcg/kg/min for 48 hours Restart IV heparin at 900 units/hr at 2000 Check CBC tonight to check platelets Check 6hr HL after restarting heparin Monitor daily HL, CBC, s/sx bleeding  57, PharmD PGY2 Cardiology Pharmacy Resident Phone: [phone] 07/09/2020  1:54 PM  Please check [email or web address] for unit-specific pharmacy phone numbers.

## 2020-07-09 NOTE — Progress Notes (Signed)
°  Echocardiogram 2D Echocardiogram has been performed.  Billy Briggs 07/09/2020, 3:46 PM

## 2020-07-10 DIAGNOSIS — N289 Disorder of kidney and ureter, unspecified: Secondary | ICD-10-CM

## 2020-07-10 LAB — HEPARIN LEVEL (UNFRACTIONATED)
Heparin Unfractionated: <0.1 IU/mL — ABNORMAL LOW (ref 0.30–0.70)
Heparin Unfractionated: 0.18 IU/mL — ABNORMAL LOW (ref 0.30–0.70)
Heparin Unfractionated: 0.22 IU/mL — ABNORMAL LOW (ref 0.30–0.70)

## 2020-07-10 LAB — BASIC METABOLIC PANEL
Anion gap: 7 (ref 5–15)
BUN: 15 mg/dL (ref 6–20)
CO2: 21 mmol/L — ABNORMAL LOW (ref 22–32)
Calcium: 7.9 mg/dL — ABNORMAL LOW (ref 8.9–10.3)
Chloride: 109 mmol/L (ref 98–111)
Creatinine, Ser: 1.57 mg/dL — ABNORMAL HIGH (ref 0.61–1.24)
GFR calc Af Amer: 57 mL/min — ABNORMAL LOW (ref >60)
GFR calc non Af Amer: 49 mL/min — ABNORMAL LOW (ref >60)
Glucose, Bld: 123 mg/dL — ABNORMAL HIGH (ref 70–99)
Potassium: 4 mmol/L (ref 3.5–5.1)
Sodium: 137 mmol/L (ref 135–145)

## 2020-07-10 LAB — CBC
HCT: 42.2 % (ref 39.0–52.0)
Hemoglobin: 14 g/dL (ref 13.0–17.0)
MCH: 28.9 pg (ref 26.0–34.0)
MCHC: 33.2 g/dL (ref 30.0–36.0)
MCV: 87 fL (ref 80.0–100.0)
Platelets: 247 10*3/uL (ref 150–400)
RBC: 4.85 MIL/uL (ref 4.22–5.81)
RDW: 13.8 % (ref 11.5–15.5)
WBC: 6.7 10*3/uL (ref 4.0–10.5)
nRBC: 0 % (ref 0.0–0.2)

## 2020-07-10 LAB — MAGNESIUM: Magnesium: 1.9 mg/dL (ref 1.7–2.4)

## 2020-07-10 MED ORDER — SODIUM CHLORIDE 0.9% FLUSH: 3.0000 mL | INTRAVENOUS | Status: DC | PRN

## 2020-07-10 MED ORDER — SODIUM CHLORIDE 0.9 % IV SOLN
INTRAVENOUS | Status: DC
  Administered 2020-07-10: 50 mL/h via INTRAVENOUS

## 2020-07-10 MED ORDER — SODIUM CHLORIDE 0.9 % WEIGHT BASED INFUSION
3.0000 mL/kg/h | INTRAVENOUS | Status: DC
  Administered 2020-07-11: 3 mL/kg/h via INTRAVENOUS

## 2020-07-10 MED ORDER — SODIUM CHLORIDE 0.9 % WEIGHT BASED INFUSION: 1.0000 mL/kg/h | INTRAVENOUS | Status: DC

## 2020-07-10 MED ORDER — SODIUM CHLORIDE 0.9 % IV SOLN: 250.0000 mL | INTRAVENOUS | Status: DC | PRN

## 2020-07-10 MED ORDER — ASPIRIN 81 MG PO CHEW
81.0000 mg | CHEWABLE_TABLET | ORAL | Status: AC
  Administered 2020-07-11: 81 mg via ORAL
  Filled 2020-07-10: qty 1

## 2020-07-10 MED ORDER — SODIUM CHLORIDE 0.9% FLUSH
3.0000 mL | Freq: Two times a day (BID) | INTRAVENOUS | Status: DC
  Administered 2020-07-10: 3 mL via INTRAVENOUS

## 2020-07-10 NOTE — Progress Notes (Signed)
ANTICOAGULATION CONSULT NOTE  Pharmacy Consult for IV heparin Indication: chest pain/ACS  No Known Allergies  Patient Measurements: Height: 6\' 1"  (185.4 cm) Weight: 88.1 kg (194 lb 3.6 oz) IBW/kg (Calculated) : 79.9 Heparin Dosing Weight: 88.5  Vital Signs: Temp: 98.8 F (37.1 C) (09/06 0727) Temp Source: Oral (09/06 0727) BP: 99/64 (09/06 0800) Pulse Rate: 81 (09/06 0830)  Labs: Recent Labs    07/08/20 2347 07/08/20 2347 07/09/20 0125 07/09/20 0751 07/09/20 2202 07/10/20 0045 07/10/20 0743  HGB 17.1*   < >  --   --  13.6 14.0  --   HCT 50.8  --   --   --  41.7 42.2  --   PLT 333  --   --   --  255 247  --   HEPARINUNFRC  --   --   --  0.40  --  <0.10* 0.18*  CREATININE 1.14  --   --   --   --  1.57*  --   TROPONINIHS 2,586*  --  5,359* 14,964*  --   --   --    < > = values in this interval not displayed.    Estimated Creatinine Clearance: 60.8 mL/min (A) (by C-G formula based on SCr of 1.57 mg/dL (H)).   Assessment: 54 year old male with no PMH other than hx of tobacco use (no AC PTA) admitted for NSTEMI. Pt now s/p LHC this AM. Pharmacy asked to dose Aggrastat for 48 hours and restart heparin infusion 8-hrs after sheath pull. Sheath was pulled around 1200 9/5.  Heparin level below goal on gtt at 1150 units/hr. No issues with line or bleeding reported per RN.  CBC stable.  Goal of Therapy:  Heparin level 0.3-0.5 units/ml Monitor platelets by anticoagulation protocol: Yes   Plan:  Increase IV heparin to 1300 units/hr Will f/u 6 hr heparin level  11/5, Reece Leader, BCPS, [facility] Clinical Pharmacist  07/10/2020 9:35 AM   [facility] pharmacy phone numbers are listed on [email or web address]

## 2020-07-10 NOTE — Plan of Care (Signed)

## 2020-07-10 NOTE — Progress Notes (Signed)
ANTICOAGULATION CONSULT NOTE  Pharmacy Consult for IV heparin Indication: chest pain/ACS  No Known Allergies  Patient Measurements: Height: 6\' 1"  (185.4 cm) Weight: 88.1 kg (194 lb 3.6 oz) IBW/kg (Calculated) : 79.9 Heparin Dosing Weight: 88.5  Vital Signs: Temp: 98 F (36.7 C) (09/06 1526) Temp Source: Oral (09/06 1115) BP: 87/59 (09/06 1400) Pulse Rate: 83 (09/06 1400)  Labs: Recent Labs    07/08/20 2347 07/08/20 2347 07/09/20 0125 07/09/20 0751 07/09/20 0751 07/09/20 2202 07/10/20 0045 07/10/20 0743 07/10/20 1601  HGB 17.1*   < >  --   --   --  13.6 14.0  --   --   HCT 50.8  --   --   --   --  41.7 42.2  --   --   PLT 333  --   --   --   --  255 247  --   --   HEPARINUNFRC  --   --   --  0.40   < >  --  <0.10* 0.18* 0.22*  CREATININE 1.14  --   --   --   --   --  1.57*  --   --   TROPONINIHS 2,586*  --  5,[id number]*  --   --   --   --   --    < > = values in this interval not displayed.    Estimated Creatinine Clearance: 60.8 mL/min (A) (by C-G formula based on SCr of 1.57 mg/dL (H)).   Assessment: 54 year old male with no PMH other than hx of tobacco use (no AC PTA) admitted for NSTEMI. Pt now s/p LHC this AM. Pharmacy asked to dose Aggrastat for 48 hours and restart heparin infusion 8-hrs after sheath pull. Sheath was pulled around 1200 9/5. -Heparin level below goal on gtt at 1300 units/hr.   Goal of Therapy:  Heparin level 0.3-0.5 units/ml Monitor platelets by anticoagulation protocol: Yes   Plan: -Increase IV heparin to 1450 units/hr -Heparin level in 6 hours and daily wth CBC daily  57, PharmD Clinical Pharmacist **Pharmacist phone directory can now be found on [email or web address] ([email or web address]).  Listed under [facility].

## 2020-07-10 NOTE — Progress Notes (Signed)
ANTICOAGULATION CONSULT NOTE  Pharmacy Consult for IV heparin Indication: chest pain/ACS  No Known Allergies  Patient Measurements: Height: 6\' 1"  (185.4 cm) Weight: 88 kg (194 lb 0.1 oz) IBW/kg (Calculated) : 79.9 Heparin Dosing Weight: 88.5  Vital Signs: Temp: 97.6 F (36.4 C) (09/05 2339) Temp Source: Oral (09/05 2339) BP: 77/58 (09/05 2300) Pulse Rate: 70 (09/05 2300)  Labs: Recent Labs    07/08/20 2347 07/08/20 2347 07/09/20 0125 07/09/20 0751 07/09/20 2202 07/10/20 0045  HGB 17.1*   < >  --   --  13.6 14.0  HCT 50.8  --   --   --  41.7 42.2  PLT 333  --   --   --  255 247  HEPARINUNFRC  --   --   --  0.40  --  <0.10*  CREATININE 1.14  --   --   --   --   --   TROPONINIHS 2,586*  --  5,359* 14,964*  --   --    < > = values in this interval not displayed.    Estimated Creatinine Clearance: 83.7 mL/min (by C-G formula based on SCr of 1.14 mg/dL).   Assessment: 54 year old male with no PMH other than hx of tobacco use (no AC PTA) admitted for NSTEMI. Pt now s/p LHC this AM. Pharmacy asked to dose Aggrastat for 48 hours and restart heparin infusion 8-hrs after sheath pull. Sheath was pulled around 1200 9/5.  Heparin level undetectable on gtt at 900 units/hr. No issues with line or bleeding reported per RN.  Goal of Therapy:  Heparin level 0.3-0.5 units/ml Monitor platelets by anticoagulation protocol: Yes   Plan:  Increase IV heparin to 1150 units/hr Will f/u 6 hr heparin level  11/5, PharmD, BCPS Please see [email or web address] for complete clinical pharmacist phone list 07/10/2020  1:23 AM

## 2020-07-10 NOTE — Progress Notes (Signed)
Progress Note  Patient Name: Billy Briggs Date of Encounter: 07/10/2020  Primary Cardiologist: new  Subjective   No recurrent chest pain; no dyspnea  Inpatient Medications    Scheduled Meds: . aspirin  81 mg Oral Daily  . atorvastatin  80 mg Oral q1800  . Chlorhexidine Gluconate Cloth  6 each Topical Daily  . sodium chloride flush  3 mL Intravenous Q12H   Continuous Infusions: . sodium chloride    . heparin 1,150 Units/hr (07/10/20 0600)  . nitroGLYCERIN Stopped (07/09/20 1312)  . tirofiban 0.15 mcg/kg/min (07/10/20 0600)   PRN Meds: sodium chloride, acetaminophen, diazepam, nitroGLYCERIN, ondansetron (ZOFRAN) IV, sodium chloride flush   Vital Signs    Vitals:   07/10/20 0500 07/10/20 0600 07/10/20 0635 07/10/20 0727  BP:   104/68   Pulse: 71 75 74   Resp: 17 (!) 24 19   Temp:    98.8 F (37.1 C)  TempSrc:      SpO2: 98% 98% 100%   Weight:   88.1 kg   Height:        Intake/Output Summary (Last 24 hours) at 07/10/2020 0805 Last data filed at 07/10/2020 0600 Gross per 24 hour  Intake 3300.92 ml  Output 1000 ml  Net 2300.92 ml    I/O since admission: +2300  Filed Weights   07/08/20 2159 07/09/20 1300 07/10/20 0635  Weight: 88.5 kg 88 kg 88.1 kg    Telemetry    Sinus- Personally Reviewed  ECG    9/6/2021ECG (independently read by me): NSR at 68; QS V1-3, with evolved T wave inversion V1-6 and I aVL  07/09/2020 ECG (independently read by me): NSR at 75, QS V1-4  Physical Exam   BP 104/68   Pulse 74   Temp 98.8 F (37.1 C)   Resp 19   Ht  (1.854 m)   Wt 88.1 kg   SpO2 100%   BMI 25.62 kg/m  General: Alert, oriented, no distress.  Skin: normal turgor, no rashes, warm and dry; multiple tattoos HEENT: Normocephalic, atraumatic. Pupils equal round and reactive to light; sclera anicteric; extraocular muscles intact;  Nose without nasal septal hypertrophy Mouth/Parynx benign; Mallinpatti scale Neck: No JVD, no carotid bruits; normal carotid  upstroke Lungs: clear to ausculatation and percussion; no wheezing or rales Chest wall: without tenderness to palpitation Heart: PMI not displaced, RRR, s1 s2 normal, 1/6 systolic murmur, no diastolic murmur, no rubs, gallops, thrills, or heaves Abdomen: soft, nontender; no hepatosplenomehaly, BS+; abdominal aorta nontender and not dilated by palpation. Back: no CVA tenderness Pulses 2+; R radial site stable Musculoskeletal: full range of motion, normal strength, no joint deformities Extremities: no clubbing cyanosis or edema, Homan's sign negative  Neurologic: grossly nonfocal; Cranial nerves grossly wnl Psychologic: Normal mood and affect   Labs    Chemistry Recent Labs  Lab 07/08/20 2347 07/10/20 0045  NA 137 137  K 3.8 4.0  CL 107 109  CO2 19* 21*  GLUCOSE 131* 123*  BUN 17 15  CREATININE 1.14 1.57*  CALCIUM 9.2 7.9*  PROT 6.9  --   ALBUMIN 4.0  --   AST 42*  --   ALT 27  --   ALKPHOS 85  --   BILITOT 1.4*  --   GFRNONAA >60 49*  GFRAA >60 57*  ANIONGAP 11 7     Hematology Recent Labs  Lab 07/08/20 2347 07/09/20 2202 07/10/20 0045  WBC 11.2* 6.8 6.7  RBC 5.91* 4.83 4.85  HGB 17.1* 13.6  14.0  HCT 50.8 41.7 42.2  MCV 86.0 86.3 87.0  MCH 28.9 28.2 28.9  MCHC 33.7 32.6 33.2  RDW 13.8 13.9 13.8  PLT 333 255 247    Cardiac EnzymesNo results for input(s): TROPONINI in the last 168 hours. No results for input(s): TROPIPOC in the last 168 hours.   BNPNo results for input(s): BNP, PROBNP in the last 168 hours.   DDimer  Recent Labs  Lab 07/08/20 2347  DDIMER <0.27     Lipid Panel     Component Value Date/Time   CHOL 117 07/09/2020 0751   TRIG 99 07/09/2020 0751   HDL 44 07/09/2020 0751   CHOLHDL 2.7 07/09/2020 0751   VLDL 20 07/09/2020 0751   LDLCALC 53 07/09/2020 0751     Radiology    DG Chest 2 View  Result Date: 07/08/2020 CLINICAL DATA:  Chest pain EXAM: CHEST - 2 VIEW COMPARISON:  None. FINDINGS: The heart size and mediastinal contours  are within normal limits. Both lungs are clear. The visualized skeletal structures are unremarkable. IMPRESSION: No active cardiopulmonary disease. Electronically Signed   By: Helyn Numbers MD   On: 07/08/2020 22:30   CARDIAC CATHETERIZATION  Result Date: 07/09/2020  Ost LAD to Prox LAD lesion is 85% stenosed.  There is severe left ventricular systolic dysfunction.  LV end diastolic pressure is moderately elevated.  The left ventricular ejection fraction is 25-35% by visual estimate.  Acute coronary syndrome secondary to probable plaque rupture at the LAD ostium with thrombus.  Depending upon the view the stenosis ranges from 50% up to 85%.  There is brisk TIMI-3 flow. Normal left circumflex, ramus intermediate, and dominant RCA. Acute ischemic cardiomyopathy with EF estimate approximately 25% with extensive wall motion abnormality involving the distribution of the LAD extending from the upper mid anterolateral wall around the apex to the mid inferior wall.  Basal contraction is vigorous. Aggrastat initiation for significant thrombus at the LAD ostium. RECOMMENDATION: The patient has otherwise pristine appearing coronary arteries.  Suspect acute plaque rupture of mild plaque at the LAD ostium which may have been contributed by the patient's recent use of cocaine leading to potential coronary vasospasm.  There is no nubbin at the LAD ostium and the thrombus extends to the distal left main.  Since TIMI-3 flow is present and the patient is pain-free, will initially plan Aggrastat infusion for minimum of 48 hours with continuation of heparin post TR band removal and attempt to resolve clot burden with plans to reassess LAD lesion for need for potential PCI with stenting up to the left main versus CABG.  Alternatively if thrombus is completely cleared intervention may not be necessary.   ECHOCARDIOGRAM COMPLETE  Result Date: 07/09/2020    ECHOCARDIOGRAM REPORT   Patient Name:   Billy Briggs Date of Exam:  07/09/2020 Medical Rec #:  [id number]     Height:       73.0 in Accession #:    [id number]    Weight:       195.0 lb Date of Birth:  06/12/1966     BSA:          2.129 m Patient Age:    54 years      BP:           103/67 mmHg Patient Gender: M             HR:           66 bpm. Exam Location:  Inpatient Procedure: 2D  Echo, Cardiac Doppler, Color Doppler and Intracardiac            Opacification Agent Indications:    Chest Pain 786.50 / R07.9  History:        Patient has no prior history of Echocardiogram examinations.                 Risk Factors:Current Smoker.  Sonographer:    Thurman Coyer RDCS (AE) Referring Phys: [id number] TRACI R TURNER IMPRESSIONS  1. Left ventricular ejection fraction, by estimation, is 35 to 40%. The left ventricle has moderately decreased function. The left ventricle demonstrates regional wall motion abnormalities (see scoring diagram/findings for description). The left ventricular internal cavity size was mildly to moderately dilated. Left ventricular diastolic parameters are indeterminate. There is severe akinesis of the left ventricular, mid-apical anteroseptal wall.  2. Right ventricular systolic function is normal. The right ventricular size is normal. There is normal pulmonary artery systolic pressure.  3. The mitral valve is normal in structure. No evidence of mitral valve regurgitation. No evidence of mitral stenosis.  4. The aortic valve is grossly normal. Aortic valve regurgitation is not visualized. No aortic stenosis is present. FINDINGS  Left Ventricle: Left ventricular ejection fraction, by estimation, is 35 to 40%. The left ventricle has moderately decreased function. The left ventricle demonstrates regional wall motion abnormalities. Severe akinesis of the left ventricular, mid-apical anteroseptal wall. Definity contrast agent was given IV to delineate the left ventricular endocardial borders. The left ventricular internal cavity size was mildly to moderately dilated. There is  no left ventricular hypertrophy. Left ventricular diastolic parameters are indeterminate. Right Ventricle: The right ventricular size is normal. No increase in right ventricular wall thickness. Right ventricular systolic function is normal. There is normal pulmonary artery systolic pressure. The tricuspid regurgitant velocity is 2.15 m/s, and  with an assumed right atrial pressure of 3 mmHg, the estimated right ventricular systolic pressure is 21.5 mmHg. Left Atrium: Left atrial size was normal in size. Right Atrium: Right atrial size was normal in size. Pericardium: There is no evidence of pericardial effusion. Mitral Valve: The mitral valve is normal in structure. No evidence of mitral valve regurgitation. No evidence of mitral valve stenosis. Tricuspid Valve: The tricuspid valve is grossly normal. Tricuspid valve regurgitation is trivial. Aortic Valve: The aortic valve is grossly normal. Aortic valve regurgitation is not visualized. No aortic stenosis is present. Pulmonic Valve: The pulmonic valve was grossly normal. Pulmonic valve regurgitation is not visualized. Aorta: The aortic root and ascending aorta are structurally normal, with no evidence of dilitation. IAS/Shunts: The atrial septum is grossly normal.  LEFT VENTRICLE PLAX 2D LVIDd:         5.30 cm  Diastology LVIDs:         4.10 cm  LV e' lateral:   8.70 cm/s LV PW:         1.00 cm  LV E/e' lateral: 7.0 LV IVS:        0.90 cm  LV e' medial:    8.49 cm/s LVOT diam:     2.40 cm  LV E/e' medial:  7.1 LV SV:         104 LV SV Index:   49 LVOT Area:     4.52 cm  RIGHT VENTRICLE RV S prime:     12.40 cm/s TAPSE (M-mode): 1.9 cm LEFT ATRIUM             Index       RIGHT ATRIUM  Index LA diam:        3.30 cm 1.55 cm/m  RA Area:     12.20 cm LA Vol (A2C):   48.4 ml 22.74 ml/m RA Volume:   28.40 ml  13.34 ml/m LA Vol (A4C):   35.7 ml 16.77 ml/m LA Biplane Vol: 43.6 ml 20.48 ml/m  AORTIC VALVE LVOT Vmax:   119.00 cm/s LVOT Vmean:  72.900 cm/s LVOT  VTI:    0.230 m  AORTA Ao Root diam: 3.20 cm MITRAL VALVE               TRICUSPID VALVE MV Area (PHT): 2.76 cm    TR Peak grad:   18.5 mmHg MV Decel Time: 275 msec    TR Vmax:        215.00 cm/s MV E velocity: 60.60 cm/s MV A velocity: 56.80 cm/s  SHUNTS MV E/A ratio:  1.07        Systemic VTI:  0.23 m                            Systemic Diam: 2.40 cm Kristeen MissPhilip Nahser MD Electronically signed by Kristeen MissPhilip Nahser MD Signature Date/Time: 07/09/2020/4:35:49 PM    Final     Cardiac Studies    Ost LAD to Prox LAD lesion is 85% stenosed.  There is severe left ventricular systolic dysfunction.  LV end diastolic pressure is moderately elevated.  The left ventricular ejection fraction is 25-35% by visual estimate.   Acute coronary syndrome secondary to probable plaque rupture at the LAD ostium with thrombus.  Depending upon the view the stenosis ranges from 50% up to 85%.  There is brisk TIMI-3 flow.  Normal left circumflex, ramus intermediate, and dominant RCA.  Acute ischemic cardiomyopathy with EF estimate approximately 25% with extensive wall motion abnormality involving the distribution of the LAD extending from the upper mid anterolateral wall around the apex to the mid inferior wall.  Basal contraction is vigorous.  Aggrastat initiation for significant thrombus at the LAD ostium.  RECOMMENDATION: The patient has otherwise pristine appearing coronary arteries.  Suspect acute plaque rupture of mild plaque at the LAD ostium which may have been contributed by the patient's recent use of cocaine leading to potential coronary vasospasm.  There is no nubbin at the LAD ostium and the thrombus extends to the distal left main.  Since TIMI-3 flow is present and the patient is pain-free, will initially plan Aggrastat infusion for minimum of 48 hours with continuation of heparin post TR band removal and attempt to resolve clot burden with plans to reassess LAD lesion for need for potential PCI with stenting up  to the left main versus CABG.  Alternatively if thrombus is completely cleared intervention may not be necessary.       ECHO: 07/09/2020 IMPRESSIONS  1. Left ventricular ejection fraction, by estimation, is 35 to 40%. The  left ventricle has moderately decreased function. The left ventricle  demonstrates regional wall motion abnormalities (see scoring  diagram/findings for description). The left  ventricular internal cavity size was mildly to moderately dilated. Left  ventricular diastolic parameters are indeterminate. There is severe  akinesis of the left ventricular, mid-apical anteroseptal wall.  2. Right ventricular systolic function is normal. The right ventricular  size is normal. There is normal pulmonary artery systolic pressure.  3. The mitral valve is normal in structure. No evidence of mitral valve  regurgitation. No evidence of mitral stenosis.  4. The  aortic valve is grossly normal. Aortic valve regurgitation is not  visualized. No aortic stenosis is present.   Patient Profile     54 y.o. male who denied any prior cardiac history but developed  recurrent episodes of chest tightness on 07/08/2020 leading to his [facility] hospital presentation.  He was transferred to [facility] and had increasing HS troponin levels.  Assessment & Plan    1. ACS: Catheterization revealed thrombus in the ostium of the LAD extending to the takeoff from the distal left main.  The patient did not have any other apparent atheromatous disease suggesting recent acute plaque rupture leading to thrombus formation.  Patient had used cocaine 2 days previously which may have induced vasospasm leading to ultimate plaque rupture and thrombus formation.  He currently is receiving Aggrastat with plans for 48-hour infusion in addition to heparin therapy.  Left ventriculography yesterday showed severe hypocontractility involving almost the entire anterolateral wall extending around the apex to supply the mid  distal inferior wall concordant with his LAD finding.  Echo Doppler study has shown an EF of 35 to 40%.  At cath the patient had TIMI-3 flow.  ECG today shows evolutionary T wave inversion across the precordium anterolateral region.  Plan relook catheterization tomorrow and hopefully there will have been thrombus dissolution.  Depending upon cath findings treatment strategies were discussed including possible need for percutaneous coronary intervention versus CABG revascularization with LIMA to LAD versus medical management.  Patient is asymptomatic without chest pain or shortness of breath.  There is no JVD.  2.  Acute ischemic cardiomyopathy: This may represent "stunned" myocardium.  Blood pressure today remains low with systolics around 98-100.  Therefore will not initiate ACE inhibition or ARB therapy presently.  3.  Renal insufficiency: Initial creatinine was 1.14, increased to 1.57 today.  Patient had received fluids post procedure.  Will reinitiate fluids at 50 cc an hour.  4.  Cocaine use: Admits to only very rare use but had used cocaine 2 days prior to his chest pain development.  Plan repeat cardiac catheterization tomorrow with possible intervention if indicated.  Lennette Bihari, MD, [facility] 07/10/2020, 8:05 AM

## 2020-07-11 ENCOUNTER — Inpatient Hospital Stay (HOSPITAL_COMMUNITY)
Admission: EM | Disposition: A | Discharge status: Home / Self Care | Payer: Self-pay | Source: Home / Self Care | Attending: Cardiovascular Disease

## 2020-07-11 ENCOUNTER — Encounter (HOSPITAL_COMMUNITY): Payer: Self-pay | Admitting: Cardiovascular Disease

## 2020-07-11 ENCOUNTER — Other Ambulatory Visit: Payer: Self-pay

## 2020-07-11 DIAGNOSIS — I255 Ischemic cardiomyopathy: Secondary | ICD-10-CM

## 2020-07-11 DIAGNOSIS — F1410 Cocaine abuse, uncomplicated: Secondary | ICD-10-CM

## 2020-07-11 DIAGNOSIS — N183 Chronic kidney disease, stage 3 unspecified: Secondary | ICD-10-CM

## 2020-07-11 HISTORY — PX: CORONARY STENT INTERVENTION: CATH118234

## 2020-07-11 HISTORY — PX: INTRAVASCULAR ULTRASOUND/IVUS: CATH118244

## 2020-07-11 HISTORY — PX: CORONARY ANGIOGRAPHY: CATH118303

## 2020-07-11 LAB — CBC
HCT: 44.1 % (ref 39.0–52.0)
Hemoglobin: 14.6 g/dL (ref 13.0–17.0)
MCH: 28.6 pg (ref 26.0–34.0)
MCHC: 33.1 g/dL (ref 30.0–36.0)
MCV: 86.3 fL (ref 80.0–100.0)
Platelets: 233 10*3/uL (ref 150–400)
RBC: 5.11 MIL/uL (ref 4.22–5.81)
RDW: 13.4 % (ref 11.5–15.5)
WBC: 6.9 10*3/uL (ref 4.0–10.5)
nRBC: 0 % (ref 0.0–0.2)

## 2020-07-11 LAB — COMPREHENSIVE METABOLIC PANEL
ALT: 32 U/L (ref 0–44)
AST: 37 U/L (ref 15–41)
Albumin: 3.1 g/dL — ABNORMAL LOW (ref 3.5–5.0)
Alkaline Phosphatase: 75 U/L (ref 38–126)
Anion gap: 10 (ref 5–15)
BUN: 12 mg/dL (ref 6–20)
CO2: 23 mmol/L (ref 22–32)
Calcium: 8.2 mg/dL — ABNORMAL LOW (ref 8.9–10.3)
Chloride: 107 mmol/L (ref 98–111)
Creatinine, Ser: 1.23 mg/dL (ref 0.61–1.24)
GFR calc Af Amer: >60 mL/min (ref >60)
GFR calc non Af Amer: >60 mL/min (ref >60)
Glucose, Bld: 118 mg/dL — ABNORMAL HIGH (ref 70–99)
Potassium: 4.2 mmol/L (ref 3.5–5.1)
Sodium: 140 mmol/L (ref 135–145)
Total Bilirubin: 0.8 mg/dL (ref 0.3–1.2)
Total Protein: 5.7 g/dL — ABNORMAL LOW (ref 6.5–8.1)

## 2020-07-11 LAB — POCT ACTIVATED CLOTTING TIME
Activated Clotting Time: 246 seconds
Activated Clotting Time: 329 seconds
Activated Clotting Time: 252 seconds
Activated Clotting Time: 373 seconds

## 2020-07-11 LAB — HEPARIN LEVEL (UNFRACTIONATED)
Heparin Unfractionated: 0.44 IU/mL (ref 0.30–0.70)
Heparin Unfractionated: 0.46 IU/mL (ref 0.30–0.70)

## 2020-07-11 LAB — HIGH SENSITIVITY CRP: CRP, High Sensitivity: 4.2 mg/L — ABNORMAL HIGH (ref 0.00–3.00)

## 2020-07-11 LAB — CALCIUM, IONIZED: Calcium, Ionized, Serum: 4.7 mg/dL (ref 4.5–5.6)

## 2020-07-11 SURGERY — CORONARY STENT INTERVENTION
Anesthesia: LOCAL

## 2020-07-11 MED ORDER — SODIUM CHLORIDE 0.9 % IV SOLN: INTRAVENOUS | Status: DC

## 2020-07-11 MED ORDER — TICAGRELOR 90 MG PO TABS
ORAL_TABLET | ORAL | Status: DC | PRN
  Administered 2020-07-11: 180 mg via ORAL

## 2020-07-11 MED ORDER — TICAGRELOR 90 MG PO TABS
ORAL_TABLET | ORAL | Status: AC
  Filled 2020-07-11: qty 1

## 2020-07-11 MED ORDER — MIDAZOLAM HCL 2 MG/2ML IJ SOLN
INTRAMUSCULAR | Status: DC | PRN
  Administered 2020-07-11 (×2): 1 mg via INTRAVENOUS
  Administered 2020-07-11: 2 mg via INTRAVENOUS

## 2020-07-11 MED ORDER — NITROGLYCERIN 1 MG/10 ML FOR IR/CATH LAB
INTRA_ARTERIAL | Status: AC
  Filled 2020-07-11: qty 10

## 2020-07-11 MED ORDER — ATORVASTATIN CALCIUM 80 MG PO TABS
80.0000 mg | ORAL_TABLET | Freq: Every day | ORAL | Status: DC
  Administered 2020-07-11: 80 mg via ORAL

## 2020-07-11 MED ORDER — LIDOCAINE HCL (PF) 1 % IJ SOLN
INTRAMUSCULAR | Status: AC
  Filled 2020-07-11: qty 30

## 2020-07-11 MED ORDER — FENTANYL CITRATE (PF) 100 MCG/2ML IJ SOLN
INTRAMUSCULAR | Status: AC
  Filled 2020-07-11: qty 2

## 2020-07-11 MED ORDER — LABETALOL HCL 5 MG/ML IV SOLN: 10.0000 mg | INTRAVENOUS | Status: AC | PRN

## 2020-07-11 MED ORDER — MIDAZOLAM HCL 2 MG/2ML IJ SOLN
INTRAMUSCULAR | Status: AC
  Filled 2020-07-11: qty 2

## 2020-07-11 MED ORDER — ACETAMINOPHEN 325 MG PO TABS: 650.0000 mg | ORAL_TABLET | ORAL | Status: DC | PRN

## 2020-07-11 MED ORDER — SODIUM CHLORIDE 0.9% FLUSH: 3.0000 mL | Freq: Two times a day (BID) | INTRAVENOUS | Status: DC

## 2020-07-11 MED ORDER — SODIUM CHLORIDE 0.9 % IV SOLN: INTRAVENOUS | Status: AC

## 2020-07-11 MED ORDER — VERAPAMIL HCL 2.5 MG/ML IV SOLN
INTRAVENOUS | Status: AC
  Filled 2020-07-11: qty 2

## 2020-07-11 MED ORDER — FENTANYL CITRATE (PF) 100 MCG/2ML IJ SOLN
INTRAMUSCULAR | Status: DC | PRN
Start: 2020-07-11 — End: 2020-07-11
  Administered 2020-07-11 (×2): 25 ug via INTRAVENOUS

## 2020-07-11 MED ORDER — HYDRALAZINE HCL 20 MG/ML IJ SOLN: 10.0000 mg | INTRAMUSCULAR | Status: AC | PRN

## 2020-07-11 MED ORDER — DIAZEPAM 5 MG PO TABS: 5.0000 mg | ORAL_TABLET | ORAL | Status: DC | PRN

## 2020-07-11 MED ORDER — HEPARIN SODIUM (PORCINE) 1000 UNIT/ML IJ SOLN
INTRAMUSCULAR | Status: DC | PRN
  Administered 2020-07-11: 4500 [IU] via INTRAVENOUS
  Administered 2020-07-11 (×2): 3000 [IU] via INTRAVENOUS
  Administered 2020-07-11: 4500 [IU] via INTRAVENOUS

## 2020-07-11 MED ORDER — ASPIRIN 81 MG PO CHEW
81.0000 mg | CHEWABLE_TABLET | Freq: Every day | ORAL | Status: DC
  Administered 2020-07-12: 81 mg via ORAL
  Filled 2020-07-11: qty 1

## 2020-07-11 MED ORDER — NITROGLYCERIN 1 MG/10 ML FOR IR/CATH LAB
INTRA_ARTERIAL | Status: DC | PRN
  Administered 2020-07-11: 100 ug via INTRACORONARY
  Administered 2020-07-11 (×2): 200 ug via INTRACORONARY

## 2020-07-11 MED ORDER — HEPARIN SODIUM (PORCINE) 1000 UNIT/ML IJ SOLN
INTRAMUSCULAR | Status: AC
  Filled 2020-07-11: qty 1

## 2020-07-11 MED ORDER — LIDOCAINE HCL (PF) 1 % IJ SOLN
INTRAMUSCULAR | Status: DC | PRN
  Administered 2020-07-11: 2 mL

## 2020-07-11 MED ORDER — SODIUM CHLORIDE 0.9% FLUSH: 3.0000 mL | INTRAVENOUS | Status: DC | PRN

## 2020-07-11 MED ORDER — IOHEXOL 350 MG/ML SOLN
INTRAVENOUS | Status: DC | PRN
  Administered 2020-07-11: 160 mL

## 2020-07-11 MED ORDER — ONDANSETRON HCL 4 MG/2ML IJ SOLN: 4.0000 mg | Freq: Four times a day (QID) | INTRAMUSCULAR | Status: DC | PRN

## 2020-07-11 MED ORDER — SODIUM CHLORIDE 0.9 % IV SOLN: 250.0000 mL | INTRAVENOUS | Status: DC | PRN

## 2020-07-11 MED ORDER — VERAPAMIL HCL 2.5 MG/ML IV SOLN
INTRAVENOUS | Status: DC | PRN
  Administered 2020-07-11: 10 mL via INTRA_ARTERIAL

## 2020-07-11 MED ORDER — TICAGRELOR 90 MG PO TABS
90.0000 mg | ORAL_TABLET | Freq: Two times a day (BID) | ORAL | Status: DC
  Administered 2020-07-11 – 2020-07-12 (×2): 90 mg via ORAL
  Filled 2020-07-11 (×2): qty 1

## 2020-07-11 MED ORDER — HEPARIN (PORCINE) IN NACL 1000-0.9 UT/500ML-% IV SOLN
INTRAVENOUS | Status: AC
  Filled 2020-07-11: qty 1000

## 2020-07-11 SURGICAL SUPPLY — 21 items
BALLN  ~~LOC~~ SAPPHIRE 4.5X12 (BALLOONS) ×1
BALLN EUPHORA RX 2.5X10 (BALLOONS) ×2
BALLN ~~LOC~~ SAPPHIRE 4.5X12 (BALLOONS) ×1
BALLOON EUPHORA RX 2.5X10 (BALLOONS) ×1 IMPLANT
BALLOON ~~LOC~~ SAPPHIRE 4.5X12 (BALLOONS) ×1 IMPLANT
CATH OPTICROSS HD (CATHETERS) ×2 IMPLANT
CATH OPTITORQUE TIG 4.0 5F (CATHETERS) ×2 IMPLANT
CATH VISTA GUIDE 6FR XBLAD3.5 (CATHETERS) ×2 IMPLANT
DEVICE RAD COMP TR BAND LRG (VASCULAR PRODUCTS) ×2 IMPLANT
ELECT DEFIB PAD ADLT CADENCE (PAD) ×2 IMPLANT
GLIDESHEATH SLEND SS 6F .021 (SHEATH) ×2 IMPLANT
GUIDEWIRE INQWIRE 1.5J.035X260 (WIRE) ×1 IMPLANT
INQWIRE 1.5J .035X260CM (WIRE) ×2
KIT ENCORE 26 ADVANTAGE (KITS) ×2 IMPLANT
KIT HEART LEFT (KITS) ×2 IMPLANT
PACK CARDIAC CATHETERIZATION (CUSTOM PROCEDURE TRAY) ×2 IMPLANT
SLED PULL BACK IVUS (MISCELLANEOUS) ×2 IMPLANT
STENT RESOLUTE ONYX 4.5X18 (Permanent Stent) ×2 IMPLANT
TRANSDUCER W/STOPCOCK (MISCELLANEOUS) ×2 IMPLANT
TUBING CIL FLEX 10 FLL-RA (TUBING) ×2 IMPLANT
WIRE COUGAR XT STRL 190CM (WIRE) ×2 IMPLANT

## 2020-07-11 NOTE — Progress Notes (Signed)
ANTICOAGULATION CONSULT NOTE  Pharmacy Consult for IV heparin Indication: chest pain/ACS   Assessment: 54 year old male with no PMH other than hx of tobacco use (no AC PTA) admitted for NSTEMI. Pt now s/p LHC this AM. Pharmacy asked to dose Aggrastat for 48 hours and restart heparin infusion 8-hrs after sheath pull. Sheath was pulled around 1200 9/5. -heparin level this am 0.44 units/ml  Goal of Therapy:  Heparin level 0.3-0.5 units/ml Monitor platelets by anticoagulation protocol: Yes   Plan: Continue IV heparin at 1450 units/hr -Heparin level in 6 hours and daily wth CBC daily  Thanks for allowing pharmacy to be a part of this patient's care.  Talbert Cage, PharmD Clinical Pharmacist

## 2020-07-11 NOTE — Plan of Care (Signed)
  Problem: Clinical Measurements: Goal: Respiratory complications will improve Outcome: Progressing Goal: Cardiovascular complication will be avoided Outcome: Progressing   Problem: Coping: Goal: Level of anxiety will decrease Outcome: Progressing   Problem: Pain Managment: Goal: General experience of comfort will improve Outcome: Progressing   

## 2020-07-11 NOTE — Progress Notes (Signed)
ANTICOAGULATION CONSULT NOTE  Pharmacy Consult for IV heparin Indication: chest pain/ACS, NSTEMI medical management  No Known Allergies  Patient Measurements: Height: 6\' 1"  (185.4 cm) Weight: 86.1 kg (189 lb 13.1 oz) IBW/kg (Calculated) : 79.9 Heparin Dosing Weight: 88.5  Vital Signs: Temp: 98.5 F (36.9 C) (09/07 0828) Temp Source: Oral (09/07 0828) BP: 102/68 (09/07 0900) Pulse Rate: 75 (09/07 0900)  Labs: Recent Labs    07/08/20 2347 07/08/20 2347 07/09/20 0125 07/09/20 0751 07/09/20 0751 07/09/20 2202 07/10/20 0045 07/10/20 0743 07/10/20 1601 07/11/20 0142 07/11/20 0917  HGB 17.1*   < >  --   --    < > 13.6 14.0  --   --  14.6  --   HCT 50.8   < >  --   --   --  41.7 42.2  --   --  44.1  --   PLT 333   < >  --   --   --  255 247  --   --  233  --   HEPARINUNFRC  --   --   --  0.40   < >  --  <0.10*   < > 0.22* 0.44 0.46  CREATININE 1.14  --   --   --   --   --  1.57*  --   --  1.23  --   TROPONINIHS 2,586*  --  5,[id number]*  --   --   --   --   --   --   --    < > = values in this interval not displayed.    Estimated Creatinine Clearance: 77.6 mL/min (by C-G formula based on SCr of 1.23 mg/dL).   Assessment: 54 year old male with no PMH other than hx of tobacco use (no AC PTA) admitted for NSTEMI. Pt now s/p LHC this AM. Pharmacy asked to dose Aggrastat for 48 hours and restart heparin infusion 8-hrs after sheath pull. Sheath was pulled around 1200 9/5. -Heparin level of 0.46 at goal. CBC within normal limits. No signs of bleeding noted.   Goal of Therapy:  Heparin level 0.3-0.5 units/ml Monitor platelets by anticoagulation protocol: Yes   Plan: -Continue Heparin IV 1450 units/hr -Heparin level and CBC daily  57, PharmD PGY1 Pharmacy Resident 07/11/2020 10:23 AM

## 2020-07-11 NOTE — Progress Notes (Signed)
Progress Note  Patient Name: Billy Briggs Date of Encounter: 07/11/2020  [facility] Cardiologist: Armanda Magic, MD   Subjective   No CP or dyspnea  Inpatient Medications    Scheduled Meds:  atorvastatin  80 mg Oral q1800   Chlorhexidine Gluconate Cloth  6 each Topical Daily   sodium chloride flush  3 mL Intravenous Q12H   sodium chloride flush  3 mL Intravenous Q12H   Continuous Infusions:  sodium chloride     sodium chloride Stopped (07/11/20 0335)   sodium chloride     sodium chloride 1 mL/kg/hr (07/11/20 0428)   heparin 1,450 Units/hr (07/11/20 0700)   nitroGLYCERIN Stopped (07/09/20 1312)   tirofiban 0.15 mcg/kg/min (07/11/20 0700)   PRN Meds: sodium chloride, sodium chloride, acetaminophen, diazepam, nitroGLYCERIN, ondansetron (ZOFRAN) IV, sodium chloride flush, sodium chloride flush   Vital Signs    Vitals:   07/11/20 0405 07/11/20 0500 07/11/20 0600 07/11/20 0700  BP:      Pulse: 65 77 77 70  Resp: (!) 21 (!) 23 11 17   Temp:      TempSrc:      SpO2: 97% 96% 96% 97%  Weight:  86.1 kg    Height:        Intake/Output Summary (Last 24 hours) at 07/11/2020 0710 Last data filed at 07/11/2020 0700 Gross per 24 hour  Intake 2336.36 ml  Output 3600 ml  Net -1263.64 ml   Last 3 Weights 07/11/2020 07/10/2020 07/09/2020  Weight (lbs) 189 lb 13.1 oz 194 lb 3.6 oz 194 lb 0.1 oz  Weight (kg) 86.1 kg 88.1 kg 88 kg      Telemetry    Sinus - Personally Reviewed  Physical Exam   GEN: No acute distress.   Neck: No JVD Cardiac: RRR, no murmurs, rubs, or gallops.  Respiratory: Clear to auscultation bilaterally. GI: Soft, nontender, non-distended  MS: No edema; radial cath site with no hematoma Neuro:  Nonfocal  Psych: Normal affect   Labs    High Sensitivity Troponin:   Recent Labs  Lab 07/08/20 2347 07/09/20 0125 07/09/20 0751  TROPONINIHS 2,586* 5,359* 14,964*      Chemistry Recent Labs  Lab 07/08/20 2347 07/10/20 0045 07/11/20 0142    NA 137 137 140  K 3.8 4.0 4.2  CL 107 109 107  CO2 19* 21* 23  GLUCOSE 131* 123* 118*  BUN 17 15 12   CREATININE 1.14 1.57* 1.23  CALCIUM 9.2 7.9* 8.2*  PROT 6.9  --  5.7*  ALBUMIN 4.0  --  3.1*  AST 42*  --  37  ALT 27  --  32  ALKPHOS 85  --  75  BILITOT 1.4*  --  0.8  GFRNONAA >60 49* >60  GFRAA >60 57* >60  ANIONGAP 11 7 10      Hematology Recent Labs  Lab 07/09/20 2202 07/10/20 0045 07/11/20 0142  WBC 6.8 6.7 6.9  RBC 4.83 4.85 5.11  HGB 13.6 14.0 14.6  HCT 41.7 42.2 44.1  MCV 86.3 87.0 86.3  MCH 28.2 28.9 28.6  MCHC 32.6 33.2 33.1  RDW 13.9 13.8 13.4  PLT 255 247 233    DDimer  Recent Labs  Lab 07/08/20 2347  DDIMER <0.27     Patient Profile     54 y.o. male admitted with non-ST elevation myocardial infarction.  Cardiac catheterization revealed probable acute plaque rupture with thrombus in the proximal LAD extending into left main.  Patient placed on heparin and Aggrastat with plans for  relook catheterization today.  Ejection fraction 25%.  Echocardiogram showed ejection fraction 35 to 40% with akinesis of the mid/apical anteroseptal wall.  Drug screen positive for cocaine.  Assessment & Plan    1 non-ST elevation myocardial infarction-cardiac catheterization reveals thrombus in proximal LAD extending into left main.  Patient is being treated with aspirin, Aggrastat and heparin.  Plan is for relook catheterization today to see if anticoagulation has improved thrombus burden.  We will then make decision concerning need for PCI versus CABG.  The risks and benefits of catheterization including myocardial infarction, CVA and death discussed and he agrees to proceed.  If CABG not planned would initiate Plavix.  Continue statin.  2 ischemic cardiomyopathy-patient's blood pressure has been low with systolic in the 80s at times.  We will add ARB and low-dose carvedilol later if blood pressure allows.  3 cocaine abuse-patient counseled on discontinuing.  4 acute  stage III kidney disease-creatinine has improved from 1.57 yesterday to 1.23 today.  We will continue to follow.  For questions or updates, please contact [facility] Please consult [email or web address] for contact info under        Signed, Olga Millers, MD  07/11/2020, 7:10 AM

## 2020-07-12 ENCOUNTER — Telehealth: Payer: Self-pay | Admitting: Physician Assistant

## 2020-07-12 ENCOUNTER — Encounter (HOSPITAL_COMMUNITY): Payer: Self-pay | Admitting: Cardiovascular Disease

## 2020-07-12 DIAGNOSIS — Z720 Tobacco use: Secondary | ICD-10-CM

## 2020-07-12 DIAGNOSIS — N179 Acute kidney failure, unspecified: Secondary | ICD-10-CM | POA: Insufficient documentation

## 2020-07-12 DIAGNOSIS — I2510 Atherosclerotic heart disease of native coronary artery without angina pectoris: Secondary | ICD-10-CM

## 2020-07-12 LAB — BASIC METABOLIC PANEL
Anion gap: 6 (ref 5–15)
BUN: 11 mg/dL (ref 6–20)
CO2: 25 mmol/L (ref 22–32)
Calcium: 8.4 mg/dL — ABNORMAL LOW (ref 8.9–10.3)
Chloride: 106 mmol/L (ref 98–111)
Creatinine, Ser: 1.14 mg/dL (ref 0.61–1.24)
GFR calc Af Amer: >60 mL/min (ref >60)
GFR calc non Af Amer: >60 mL/min (ref >60)
Glucose, Bld: 102 mg/dL — ABNORMAL HIGH (ref 70–99)
Potassium: 3.8 mmol/L (ref 3.5–5.1)
Sodium: 137 mmol/L (ref 135–145)

## 2020-07-12 LAB — CBC
HCT: 41.2 % (ref 39.0–52.0)
Hemoglobin: 13.5 g/dL (ref 13.0–17.0)
MCH: 28 pg (ref 26.0–34.0)
MCHC: 32.8 g/dL (ref 30.0–36.0)
MCV: 85.5 fL (ref 80.0–100.0)
Platelets: 220 10*3/uL (ref 150–400)
RBC: 4.82 MIL/uL (ref 4.22–5.81)
RDW: 13.4 % (ref 11.5–15.5)
WBC: 6.3 10*3/uL (ref 4.0–10.5)
nRBC: 0 % (ref 0.0–0.2)

## 2020-07-12 MED ORDER — ASPIRIN EC 81 MG PO TBEC: 81.0000 mg | DELAYED_RELEASE_TABLET | Freq: Every day | ORAL | Status: DC

## 2020-07-12 MED ORDER — NITROGLYCERIN 0.4 MG SL SUBL: 0.4000 mg | SUBLINGUAL_TABLET | SUBLINGUAL | 3 refills | Status: DC | PRN

## 2020-07-12 MED ORDER — ATORVASTATIN CALCIUM 80 MG PO TABS: 80.0000 mg | ORAL_TABLET | Freq: Every evening | ORAL | 6 refills | Status: DC

## 2020-07-12 MED ORDER — ASPIRIN 81 MG PO TBEC: 81.0000 mg | DELAYED_RELEASE_TABLET | Freq: Every day | ORAL | 11 refills | Status: AC

## 2020-07-12 MED ORDER — CARVEDILOL 3.125 MG PO TABS
3.1250 mg | ORAL_TABLET | Freq: Two times a day (BID) | ORAL | Status: DC
  Administered 2020-07-12: 3.125 mg via ORAL
  Filled 2020-07-12: qty 1

## 2020-07-12 MED ORDER — CARVEDILOL 3.125 MG PO TABS: 3.1250 mg | ORAL_TABLET | Freq: Two times a day (BID) | ORAL | 6 refills | Status: DC

## 2020-07-12 MED ORDER — TICAGRELOR 90 MG PO TABS: 90.0000 mg | ORAL_TABLET | Freq: Two times a day (BID) | ORAL | 11 refills | Status: DC

## 2020-07-12 MED FILL — BRILINTA 90 MG TABLET: 90 | 30 days supply | Qty: 60 | Fill #0

## 2020-07-12 MED FILL — NITROGLYCERIN 0.4 MG TAB SL: 0.4 | 8 days supply | Qty: 25 | Fill #0

## 2020-07-12 MED FILL — ATORVASTATIN CALCIUM 80 MG: 80 | 30 days supply | Qty: 30 | Fill #0

## 2020-07-12 MED FILL — CARVEDILOL 3.125 MG TABLET: 3.125 | 30 days supply | Qty: 60 | Fill #0

## 2020-07-12 MED FILL — ASPIRIN LOW DOSE 81 MG TBEC: 81 | 30 days supply | Qty: 30 | Fill #0

## 2020-07-12 NOTE — Telephone Encounter (Addendum)
    Attention TOC pool,  This patient will need a TOC phone call after discharge. They are being discharged today. Follow-up appointment has already been arranged with:  Addendum: pt prefers [place] office, 9/16 with Nena Polio They are a patient of Armanda Magic, MD.  Thank you! Laurann Montana, PA-C

## 2020-07-12 NOTE — TOC Transition Note (Signed)
Transition of Care [facility]) - CM/SW Discharge Note   Patient Details  Name: Billy Briggs MRN: [id number] Date of Birth: [birth year]  Transition [facility]) CM/SW Contact:  Zenon Mayo, RN Phone Number: 07/12/2020, 12:12 PM   Clinical Narrative:    Patient is for dc today, he is on brilinta NCM informed him of the 35.00 co pay amt for refills if his deductible is not met for medications , but if it has been met he can use the 5.00 co pay card.  TOC is filling the first 30 days free for him with coupon.   Final next level of care: Home/Self Care Barriers to Discharge: No Barriers Identified   Patient Goals and CMS Choice Patient states their goals for this hospitalization and ongoing recovery are:: get better   Choice offered to / list presented to : NA  Discharge Placement                       Discharge Plan and Services                  DME Agency: NA       HH Arranged: NA          Social Determinants of Health (SDOH) Interventions     Readmission Risk Interventions No flowsheet data found.

## 2020-07-12 NOTE — Progress Notes (Addendum)
Patient lives in [place] but no appointment availability. He prefers to f/u in [place] instead of [address] - will arrange f/u there instead for 1 week TOC Visit. Will need to establish with cardiologist with our [facility] team thereafter for appointment in about 8 weeks, which can be arranged at time of f/u. Carston Riedl PA-C

## 2020-07-12 NOTE — Plan of Care (Signed)

## 2020-07-12 NOTE — Progress Notes (Signed)
Progress Note  Patient Name: Billy Briggs Date of Encounter: 07/12/2020  [facility] Cardiologist: Armanda Magic, MD   Subjective   Pt denies CP or dyspnea  Inpatient Medications    Scheduled Meds:  aspirin  81 mg Oral Daily   atorvastatin  80 mg Oral q1800   Chlorhexidine Gluconate Cloth  6 each Topical Daily   sodium chloride flush  3 mL Intravenous Q12H   ticagrelor  90 mg Oral BID   Continuous Infusions:  sodium chloride     sodium chloride Stopped (07/11/20 0335)   sodium chloride     nitroGLYCERIN Stopped (07/09/20 1312)   PRN Meds: sodium chloride, sodium chloride, acetaminophen, diazepam, nitroGLYCERIN, ondansetron (ZOFRAN) IV, sodium chloride flush   Vital Signs    Vitals:   07/12/20 0300 07/12/20 0331 07/12/20 0600 07/12/20 0738  BP: 98/69 98/69 98/73  104/74  Pulse: 71 83 79 77  Resp: 18 20 20 16   Temp:  98 F (36.7 C)  98.3 F (36.8 C)  TempSrc:  Oral  Oral  SpO2: 95% 94% 96% 95%  Weight:      Height:        Intake/Output Summary (Last 24 hours) at 07/12/2020 0748 Last data filed at 07/12/2020 0600 Gross per 24 hour  Intake 928.9 ml  Output 4050 ml  Net -3121.1 ml   Last 3 Weights 07/11/2020 07/10/2020 07/09/2020  Weight (lbs) 189 lb 13.1 oz 194 lb 3.6 oz 194 lb 0.1 oz  Weight (kg) 86.1 kg 88.1 kg 88 kg      Telemetry    Sinus - Personally Reviewed  Physical Exam   GEN: No acute distress.  WD/WN Neck: No JVD, supple Cardiac: RRR Respiratory: CTA GI: Soft, NT/ND MS: No edema; radial cath site with no hematoma Neuro:  Grossly intact Psych: Normal affect   Labs    High Sensitivity Troponin:   Recent Labs  Lab 07/08/20 2347 07/09/20 0125 07/09/20 0751  TROPONINIHS 2,586* 5,359* 14,964*      Chemistry Recent Labs  Lab 07/08/20 2347 07/08/20 2347 07/10/20 0045 07/11/20 0142 07/12/20 0045  NA 137   < > 137 140 137  K 3.8   < > 4.0 4.2 3.8  CL 107   < > 109 107 106  CO2 19*   < > 21* 23 25  GLUCOSE 131*   < > 123*  118* 102*  BUN 17   < > 15 12 11   CREATININE 1.14   < > 1.57* 1.23 1.14  CALCIUM 9.2   < > 7.9* 8.2* 8.4*  PROT 6.9  --   --  5.7*  --   ALBUMIN 4.0  --   --  3.1*  --   AST 42*  --   --  37  --   ALT 27  --   --  32  --   ALKPHOS 85  --   --  75  --   BILITOT 1.4*  --   --  0.8  --   GFRNONAA >60   < > 49* >60 >60  GFRAA >60   < > 57* >60 >60  ANIONGAP 11   < > 7 10 6    < > = values in this interval not displayed.     Hematology Recent Labs  Lab 07/10/20 0045 07/11/20 0142 07/12/20 0045  WBC 6.7 6.9 6.3  RBC 4.85 5.11 4.82  HGB 14.0 14.6 13.5  HCT 42.2 44.1 41.2  MCV 87.0 86.3 85.5  MCH 28.9 28.6 28.0  MCHC 33.2 33.1 32.8  RDW 13.8 13.4 13.4  PLT 247 233 220    DDimer  Recent Labs  Lab 07/08/20 2347  DDIMER <0.27     Patient Profile     54 y.o. male admitted with non-ST elevation myocardial infarction.  Cardiac catheterization revealed probable acute plaque rupture with thrombus in the proximal LAD extending into left main.  Patient placed on heparin and Aggrastat with plans for relook catheterization today.  Ejection fraction 25%.  Echocardiogram showed ejection fraction 35 to 40% with akinesis of the mid/apical anteroseptal wall.  Drug screen positive for cocaine.  Assessment & Plan    1 non-ST elevation myocardial infarction-patient is now status post PCI of proximal LAD with drug-eluting stent.  Continue aspirin, Brilinta and statin.  2 ischemic cardiomyopathy-patient's blood pressure remains borderline at times.  I will try low-dose carvedilol today.  Begin 3.125 mg twice daily.  Hopefully his blood pressure will tolerate.  Will add an ARB as an outpatient if blood pressure allows.  Will need follow-up echocardiogram 3 months after medications fully titrated.  If ejection fraction remains less than 35% would need to proceed with ICD.  Patient will need to demonstrate that he could avoid cocaine use prior to device.  3 cocaine abuse-patient counseled on  avoiding.  4 acute stage III kidney disease-improved.  Plan discharge today on present medications.  Needs transition of care appointment with APP in 1 to 2 weeks.  Follow-up Dr. Mayford Knife in 8 weeks.  Greater than 30 minutes PA and physician time. D2  For questions or updates, please contact [facility] Please consult [email or web address] for contact info under        Signed, Olga Millers, MD  07/12/2020, 7:48 AM

## 2020-07-12 NOTE — Progress Notes (Signed)
CARDIAC REHAB PHASE I   PRE:  Rate/Rhythm: 78 SR  BP:  Sitting: 104/70      SaO2: 95 RA  MODE:  Ambulation: 400 ft   POST:  Rate/Rhythm: 98 SR  BP:  Sitting: 125/101    SaO2: 93 RA  Pt ambulated 460ft in hallway independently with steady gait. Pt denies CP, SOB, or dizziness. Pt educated on importance of ASA, Brilinta, statin, and NTG. Pt given MI book along with heart healthy diet. Reviewed restrictions, site care, and exercise guidelines. Will refer to CRP II [place].  [id number] Reynold Bowen, RN BSN 07/12/2020 9:40 AM

## 2020-07-12 NOTE — Discharge Summary (Signed)
Discharge Summary    Patient ID: Billy Briggs MRN: [id number]; DOB: July 09, 1966  Admit date: 07/08/2020 Discharge date: 07/12/2020  Primary Care Provider: Patient, No Pcp Per  Primary Cardiologist: Armanda Magic, MD  Primary Electrophysiologist:  None   Discharge Diagnoses    Principal Problem:   NSTEMI (non-ST elevated myocardial infarction) [facility]) Active Problems:   Cocaine abuse (HCC)   Ischemic cardiomyopathy   Tobacco abuse   CAD in native artery   AKI (acute kidney injury) [facility])   Diagnostic Studies/Procedures    PCI 07/11/20  Ost LAD to Prox LAD lesion is 85% stenosed.  Post intervention, there is a 0% residual stenosis.  A stent was successfully placed.   Difficult but successful percutaneous coronary intervention of the LAD ostium utilizing intravascular ultrasound with ultimate insertion of a 4.5 x 18 mm Resolute Onyx stent inserted at the ostium extending beyond the diagonal vessel, postdilated to 4.6 mm with resolution of thrombus burden and the stenosis being reduced from 85% to 0% and brisk TIMI-3 flow.  RECOMMENDATION: DAPT for minimum of 1 year.  Will discontinue Aggrastat post procedure.  Aggressive lipid-lowering therapy with target LDL less than 70.  Patient was strongly advised to DC his mild tobacco use and never take cocaine again.  2D echo 07/09/20  IMPRESSIONS    1. Left ventricular ejection fraction, by estimation, is 35 to 40%. The  left ventricle has moderately decreased function. The left ventricle  demonstrates regional wall motion abnormalities (see scoring  diagram/findings for description). The left  ventricular internal cavity size was mildly to moderately dilated. Left  ventricular diastolic parameters are indeterminate. There is severe  akinesis of the left ventricular, mid-apical anteroseptal wall.  2. Right ventricular systolic function is normal. The right ventricular  size is normal. There is normal pulmonary artery systolic  pressure.  3. The mitral valve is normal in structure. No evidence of mitral valve  regurgitation. No evidence of mitral stenosis.  4. The aortic valve is grossly normal. Aortic valve regurgitation is not  visualized. No aortic stenosis is present.   Diagnostic Cath 07/09/20  Ost LAD to Prox LAD lesion is 85% stenosed.  There is severe left ventricular systolic dysfunction.  LV end diastolic pressure is moderately elevated.  The left ventricular ejection fraction is 25-35% by visual estimate.   Acute coronary syndrome secondary to probable plaque rupture at the LAD ostium with thrombus.  Depending upon the view the stenosis ranges from 50% up to 85%.  There is brisk TIMI-3 flow.  Normal left circumflex, ramus intermediate, and dominant RCA.  Acute ischemic cardiomyopathy with EF estimate approximately 25% with extensive wall motion abnormality involving the distribution of the LAD extending from the upper mid anterolateral wall around the apex to the mid inferior wall.  Basal contraction is vigorous.  Aggrastat initiation for significant thrombus at the LAD ostium.  RECOMMENDATION: The patient has otherwise pristine appearing coronary arteries.  Suspect acute plaque rupture of mild plaque at the LAD ostium which may have been contributed by the patient's recent use of cocaine leading to potential coronary vasospasm.  There is no nubbin at the LAD ostium and the thrombus extends to the distal left main.  Since TIMI-3 flow is present and the patient is pain-free, will initially plan Aggrastat infusion for minimum of 48 hours with continuation of heparin post TR band removal and attempt to resolve clot burden with plans to reassess LAD lesion for need for potential PCI with stenting up to the left main  versus CABG.  Alternatively if thrombus is completely cleared intervention may not be necessary.    _____________   History of Present Illness     Billy Briggs is a 54 y.o. male with  tobacco abuse who presented to [facility] upon transfer from [facility] with chest pain and elevated troponin. He initially developed left sided chest burning while laying on the couch watching a movie, with radiation to his left arm. Over the course of several hours he took ibuprofen without relief. He also had associated DOE and nausea/vomiting. He had had a similar episode a week prior lasting about an hour. His symptoms were somewhat atypical as they were worse in the supine position and better sitting up. Due to worsening chest pain he presented to the ED where hsTroponin was elevated at [id number]>>[id number].  WBC elevated at 11.2, DDimer normal, SCr 1.14.  COVID 19 negative. EKG showed diffuse ST/T wave abnormality and anterior infarct. He was transferred to [facility] for further evaluation. UDS also positive for cocaine.  Hospital Course     1. NSTEMI/CAD - was started on IV heparin and ASA with peak troponin of 14,964. CRP also elevated. He underwent cardiac catheterization 07/09/20 showing probable plaque rupture at the LAD ostium with thrombus.  Depending upon the view the stenosis ranged from 50% up to 85%. LVEF was approximately 25%. He otherwise had normal coronary arteries. It was suspected he had had acute plaque rupture possibly due to patient's recent use of cocaine leading to potential coronary vasospasm. He was placed on aggrastat infusion and continued on heparin with plan to reassess LAD lesion by repeat cath in 2 days. 2D echo 07/09/20 showed LVEF 35-40%. Relook cath yesterday showed continued 85% LAD stenosis, therefore he had difficult but successful percutaneous coronary intervention of the LAD ostium utilizing intravascular ultrasound with ultimate insertion of a 4.5 x 18 mm Resolute Onyx stent inserted at the ostium extending beyond the diagonal vessel. He will go home on ASA, Brilinta, statin, and very low dose beta blocker as the patient states he will not use cocaine again. If the patient is tolerating statin at  time of follow-up appointment, would consider rechecking liver function/lipid panel in 6 weeks.  2. Ischemic cardiomyopathy - guideline directed therapy limited by borderline blood pressure. Low dose beta blocker was added with plan to add ARB as an outpatient if blood pressure allows. He will need arrangement of a follow-up echocardiogram 3 months after medications fully titrated.  If ejection fraction remains less than 35% would need to proceed with ICD. Patient will need to demonstrate that he could avoid cocaine use prior to device. Reviewed 2g sodium restriction, 2L fluid restriction, daily weights with patient.  3. Cocaine abuse and tobacco abuse - patient counseled on absolute need to discontinue cocaine or it will have fatal effects. He verbalized understanding.  4. AKI - Cr 1.14 on arrival, peak 1.57 on 07/10/20, improved to 1.14 on discharge.  Dr. Jens Som has seen and examined the patient today and feels he is stable for discharge. He works in [employer] so will need to be seen back in follow-up before determining clearance for work. TOC f/u arranged and pt also elected to utilize the [facility] pharmacy.  Did the patient have an acute coronary syndrome (MI, NSTEMI, STEMI, etc) this admission?:  Yes                               AHA/ACC Clinical Performance &  Quality Measures: 1. Aspirin prescribed? - Yes 2. ADP Receptor Inhibitor (Plavix/Clopidogrel, Brilinta/Ticagrelor or Effient/Prasugrel) prescribed (includes medically managed patients)? - Yes 3. Beta Blocker prescribed? - Yes 4. High Intensity Statin (Lipitor 40-80mg  or Crestor 20-40mg ) prescribed? - Yes 5. EF assessed during THIS hospitalization? - Yes 6. For EF <40%, was ACEI/ARB prescribed? - No - Reason:  hypotension 7. For EF <40%, Aldosterone Antagonist (Spironolactone or Eplerenone) prescribed? - No - Reason:  hypotension   8. Cardiac Rehab Phase II ordered (including medically managed patients)? - Yes    _____________  Discharge Vitals Blood pressure 99/72, pulse 69, temperature 98.3 F (36.8 C), temperature source Oral, resp. rate 18, height 6\' 1"  (1.854 m), weight 86.1 kg, SpO2 94 %.  Filed Weights   07/09/20 1300 07/10/20 0635 07/11/20 0500  Weight: 88 kg 88.1 kg 86.1 kg    Labs & Radiologic Studies    CBC Recent Labs    07/11/20 0142 07/12/20 0045  WBC 6.9 6.3  HGB 14.6 13.5  HCT 44.1 41.2  MCV 86.3 85.5  PLT 233 220   Basic Metabolic Panel Recent Labs    09/11/20 0045 07/10/20 1030 07/11/20 0142 07/12/20 0045  NA   < >  --  140 137  K   < >  --  4.2 3.8  CL   < >  --  107 106  CO2   < >  --  23 25  GLUCOSE   < >  --  118* 102*  BUN   < >  --  12 11  CREATININE   < >  --  1.23 1.14  CALCIUM   < >  --  8.2* 8.4*  MG  --  1.9  --   --    < > = values in this interval not displayed.   Liver Function Tests Recent Labs    07/11/20 0142  AST 37  ALT 32  ALKPHOS 75  BILITOT 0.8  PROT 5.7*  ALBUMIN 3.1*   No results for input(s): LIPASE, AMYLASE in the last 72 hours. High Sensitivity Troponin:   Recent Labs  Lab 07/08/20 2347 07/09/20 0125 07/09/20 0751  TROPONINIHS 2,586* 5,359* 14,964*    ____________  DG Chest 2 View  Result Date: 07/08/2020 CLINICAL DATA:  Chest pain EXAM: CHEST - 2 VIEW COMPARISON:  None. FINDINGS: The heart size and mediastinal contours are within normal limits. Both lungs are clear. The visualized skeletal structures are unremarkable. IMPRESSION: No active cardiopulmonary disease. Electronically Signed   By: 09/07/2020 MD   On: 07/08/2020 22:30   CARDIAC CATHETERIZATION  Result Date: 07/11/2020  Ost LAD to Prox LAD lesion is 85% stenosed.  Post intervention, there is a 0% residual stenosis.  A stent was successfully placed.  Difficult but successful percutaneous coronary intervention of the LAD ostium utilizing intravascular ultrasound with ultimate insertion of a 4.5 x 18 mm Resolute Onyx stent inserted at the ostium  extending beyond the diagonal vessel, postdilated to 4.6 mm with resolution of thrombus burden and the stenosis being reduced from 85% to 0% and brisk TIMI-3 flow. RECOMMENDATION: DAPT for minimum of 1 year.  Will discontinue Aggrastat post procedure.  Aggressive lipid-lowering therapy with target LDL less than 70.  Patient was strongly advised to DC his mild tobacco use and never take cocaine again.   CARDIAC CATHETERIZATION  Result Date: 07/09/2020  Ost LAD to Prox LAD lesion is 85% stenosed.  There is severe left ventricular systolic dysfunction.  LV  end diastolic pressure is moderately elevated.  The left ventricular ejection fraction is 25-35% by visual estimate.  Acute coronary syndrome secondary to probable plaque rupture at the LAD ostium with thrombus.  Depending upon the view the stenosis ranges from 50% up to 85%.  There is brisk TIMI-3 flow. Normal left circumflex, ramus intermediate, and dominant RCA. Acute ischemic cardiomyopathy with EF estimate approximately 25% with extensive wall motion abnormality involving the distribution of the LAD extending from the upper mid anterolateral wall around the apex to the mid inferior wall.  Basal contraction is vigorous. Aggrastat initiation for significant thrombus at the LAD ostium. RECOMMENDATION: The patient has otherwise pristine appearing coronary arteries.  Suspect acute plaque rupture of mild plaque at the LAD ostium which may have been contributed by the patient's recent use of cocaine leading to potential coronary vasospasm.  There is no nubbin at the LAD ostium and the thrombus extends to the distal left main.  Since TIMI-3 flow is present and the patient is pain-free, will initially plan Aggrastat infusion for minimum of 48 hours with continuation of heparin post TR band removal and attempt to resolve clot burden with plans to reassess LAD lesion for need for potential PCI with stenting up to the left main versus CABG.  Alternatively if thrombus  is completely cleared intervention may not be necessary.   ECHOCARDIOGRAM COMPLETE  Result Date: 07/09/2020    ECHOCARDIOGRAM REPORT   Patient Name:   AVIR DERUITER Date of Exam: 07/09/2020 Medical Rec #:  [id number]     Height:       73.0 in Accession #:    [id number]    Weight:       195.0 lb Date of Birth:  1966-06-18     BSA:          2.129 m Patient Age:    54 years      BP:           103/67 mmHg Patient Gender: M             HR:           66 bpm. Exam Location:  Inpatient Procedure: 2D Echo, Cardiac Doppler, Color Doppler and Intracardiac            Opacification Agent Indications:    Chest Pain 786.50 / R07.9  History:        Patient has no prior history of Echocardiogram examinations.                 Risk Factors:Current Smoker.  Sonographer:    Thurman Coyer RDCS (AE) Referring Phys: [id number] TRACI R TURNER IMPRESSIONS  1. Left ventricular ejection fraction, by estimation, is 35 to 40%. The left ventricle has moderately decreased function. The left ventricle demonstrates regional wall motion abnormalities (see scoring diagram/findings for description). The left ventricular internal cavity size was mildly to moderately dilated. Left ventricular diastolic parameters are indeterminate. There is severe akinesis of the left ventricular, mid-apical anteroseptal wall.  2. Right ventricular systolic function is normal. The right ventricular size is normal. There is normal pulmonary artery systolic pressure.  3. The mitral valve is normal in structure. No evidence of mitral valve regurgitation. No evidence of mitral stenosis.  4. The aortic valve is grossly normal. Aortic valve regurgitation is not visualized. No aortic stenosis is present. FINDINGS  Left Ventricle: Left ventricular ejection fraction, by estimation, is 35 to 40%. The left ventricle has moderately decreased function. The left ventricle demonstrates regional wall  motion abnormalities. Severe akinesis of the left ventricular, mid-apical anteroseptal  wall. Definity contrast agent was given IV to delineate the left ventricular endocardial borders. The left ventricular internal cavity size was mildly to moderately dilated. There is no left ventricular hypertrophy. Left ventricular diastolic parameters are indeterminate. Right Ventricle: The right ventricular size is normal. No increase in right ventricular wall thickness. Right ventricular systolic function is normal. There is normal pulmonary artery systolic pressure. The tricuspid regurgitant velocity is 2.15 m/s, and  with an assumed right atrial pressure of 3 mmHg, the estimated right ventricular systolic pressure is 21.5 mmHg. Left Atrium: Left atrial size was normal in size. Right Atrium: Right atrial size was normal in size. Pericardium: There is no evidence of pericardial effusion. Mitral Valve: The mitral valve is normal in structure. No evidence of mitral valve regurgitation. No evidence of mitral valve stenosis. Tricuspid Valve: The tricuspid valve is grossly normal. Tricuspid valve regurgitation is trivial. Aortic Valve: The aortic valve is grossly normal. Aortic valve regurgitation is not visualized. No aortic stenosis is present. Pulmonic Valve: The pulmonic valve was grossly normal. Pulmonic valve regurgitation is not visualized. Aorta: The aortic root and ascending aorta are structurally normal, with no evidence of dilitation. IAS/Shunts: The atrial septum is grossly normal.  LEFT VENTRICLE PLAX 2D LVIDd:         5.30 cm  Diastology LVIDs:         4.10 cm  LV e' lateral:   8.70 cm/s LV PW:         1.00 cm  LV E/e' lateral: 7.0 LV IVS:        0.90 cm  LV e' medial:    8.49 cm/s LVOT diam:     2.40 cm  LV E/e' medial:  7.1 LV SV:         104 LV SV Index:   49 LVOT Area:     4.52 cm  RIGHT VENTRICLE RV S prime:     12.40 cm/s TAPSE (M-mode): 1.9 cm LEFT ATRIUM             Index       RIGHT ATRIUM           Index LA diam:        3.30 cm 1.55 cm/m  RA Area:     12.20 cm LA Vol (A2C):   48.4 ml 22.74  ml/m RA Volume:   28.40 ml  13.34 ml/m LA Vol (A4C):   35.7 ml 16.77 ml/m LA Biplane Vol: 43.6 ml 20.48 ml/m  AORTIC VALVE LVOT Vmax:   119.00 cm/s LVOT Vmean:  72.900 cm/s LVOT VTI:    0.230 m  AORTA Ao Root diam: 3.20 cm MITRAL VALVE               TRICUSPID VALVE MV Area (PHT): 2.76 cm    TR Peak grad:   18.5 mmHg MV Decel Time: 275 msec    TR Vmax:        215.00 cm/s MV E velocity: 60.60 cm/s MV A velocity: 56.80 cm/s  SHUNTS MV E/A ratio:  1.07        Systemic VTI:  0.23 m                            Systemic Diam: 2.40 cm Kristeen Miss MD Electronically signed by Kristeen Miss MD Signature Date/Time: 07/09/2020/4:35:49 PM    Final    Disposition  Pt is being discharged home today in good condition.  Follow-up Plans & Appointments     Follow-up Information    Netta NeatQuinn, Andrew L Jr., NP Follow up.   Specialty: Cardiology Why: A follow-up has been arranged for you as listed with Nena PolioAndy Quinn Thursday July 20, 2020 3:00 PM (Arrive by 2:45 PM). Mardelle Mattendy is one of the PAs that works with our [facility] cardiology team. Contact information: [address] [place] [address] [phone]              Discharge Instructions    Amb Referral to Cardiac Rehabilitation   Complete by: As directed    Diagnosis:  Coronary Stents NSTEMI     After initial evaluation and assessments completed: Virtual Based Care may be provided alone or in conjunction with Phase 2 Cardiac Rehab based on patient barriers.: Yes   Diet - low sodium heart healthy   Complete by: As directed    Discharge instructions   Complete by: As directed    Patients taking blood thinners should generally stay away from medicines like ibuprofen, Advil, Motrin, naproxen, and Aleve due to risk of stomach bleeding. You may take Tylenol as directed or talk to your primary doctor about alternatives.  One of your heart tests showed weakness of the heart muscle this admission. This may make you more susceptible to weight gain  from fluid retention, which can lead to symptoms that we call heart failure. Please follow these special instructions:  1. Follow a low-salt diet - you are allowed no more than 2,000mg  of sodium per day. Watch your fluid intake. In general, you should not be taking in more than 2 liters of fluid per day (no more than 8 glasses per day). This includes sources of water in foods like soup, coffee, tea, milk, etc. 2. Weigh yourself on the same scale at same time of day and keep a log. 3. Call your doctor: (Anytime you feel any of the following symptoms)  - 3lb weight gain overnight or 5lb within a few days - Shortness of breath, with or without a dry hacking cough  - Swelling in the hands, feet or stomach  - If you have to sleep on extra pillows at night in order to breathe   IT IS IMPORTANT TO LET YOUR DOCTOR KNOW EARLY ON IF YOU ARE HAVING SYMPTOMS SO WE CAN HELP YOU!  You should not use cocaine again as this could interact with your medications or result in recurrent heart attack or death.   Increase activity slowly   Complete by: As directed    No driving for 1 week. No lifting over 10 lbs for 2 weeks. No sexual activity for 2 weeks. You may not return to work until cleared by your cardiology provider. Keep procedure site clean & dry. If you notice increased pain, swelling, bleeding or pus, call/return!  You may shower, but no soaking baths/hot tubs/pools for 1 week.      Discharge Medications   Allergies as of 07/12/2020   No Known Allergies     Medication List    STOP taking these medications   ibuprofen 200 MG tablet Commonly known as: ADVIL     TAKE these medications   aspirin 81 MG EC tablet Take 1 tablet (81 mg total) by mouth daily. Swallow whole. Start taking on: July 13, 2020   atorvastatin 80 MG tablet Commonly known as: LIPITOR Take 1 tablet (80 mg total) by mouth every evening.  carvedilol 3.125 MG tablet Commonly known as: COREG Take 1 tablet (3.125 mg total)  by mouth 2 (two) times daily with a meal.   diphenhydramine-acetaminophen 25-500 MG Tabs tablet Commonly known as: TYLENOL PM Take 1 tablet by mouth at bedtime as needed.   nitroGLYCERIN 0.4 MG SL tablet Commonly known as: NITROSTAT Place 1 tablet (0.4 mg total) under the tongue every 5 (five) minutes as needed for chest pain (up to 3 doses).   ticagrelor 90 MG Tabs tablet Commonly known as: BRILINTA Take 1 tablet (90 mg total) by mouth 2 (two) times daily.          Outstanding Labs/Studies   If the patient is tolerating statin at time of follow-up appointment, would consider rechecking liver function/lipid panel in 6-8 weeks.   Duration of Discharge Encounter   Greater than 30 minutes including physician time.  Signed, Laurann Montana, PA-C 07/12/2020, 11:42 AM

## 2020-07-13 MED FILL — Heparin Sod (Porcine)-NaCl IV Soln 1000 Unit/500ML-0.9%: INTRAVENOUS | Qty: 1000 | Status: AC

## 2020-07-19 NOTE — Progress Notes (Signed)
Cardiology Office Note  Date: 07/20/2020   ID: Billy Briggs, DOB May 25, 1966, MRN [id number]  PCP:  Billy Briggs, No Pcp Per  Cardiologist:  Armanda Magic, MD Electrophysiologist:  None   Chief Complaint: Hospital follow-up NSTEMI  History of Present Illness: General Wearing is a 54 y.o. male with a history of NSTEMI, cocaine abuse, ischemic cardiomyopathy, CAD, tobacco use, acute kidney injury.  Presented to [facility] upon transfer from [facility] with chest pain and elevated troponins.  Complaining of left-sided chest pain burning with radiation to left arm, had associated DOE, nausea,  and vomiting.  Had a similar episode a week prior lasting about an hour.  His troponins were elevated at 2586--5359.  WBC 11.2, D-dimer normal, creatinine 1.14, Covid negative, EKG diffuse ST/T wave abnormalities in anterior infarct.  UDS positive for cocaine.  Cardiac catheterization 07/09/2020 showing probable plaque rupture at LAD ostium with thrombus.  Stenosis ranged from 50% up to 85%.  LVEF was approximately 25%.  Otherwise normal coronary arteries.  Suspected acute plaque rupture due to recent cocaine use with potential coronary artery vasospasm.  Echocardiogram 07/10/2019.  Had a relook catheterization showing 85% LAD stenosis.  Difficult but successful PCI to LAD with insertion of 4.5 x 18 mm resolute Onyx stent.  Discharged on aspirin, Brilinta, statin, and very low-dose beta-blocker.  Recommendations were if Billy Briggs was tolerating statin at time of follow-up appointment would consider rechecking LFTs in FLP's in 6 weeks.  Recommended follow-up echocardiogram in 3 months after medications titrated and if EF remains less than 35% we will need to proceed with ICD.  Billy Briggs would need to demonstrate he could avoid cocaine use prior to device placement.  His creatinine on arrival was 1.14.  It peaked at 1.57.  It improved to 1.14 prior to discharge.  Billy Briggs is here for post PCI follow-up.  He denies any anginal  or exertional symptoms.  His right radial access looks good with very minor bruising.  Has 2+ radial plus in that arm.  Blood pressure is below 100 systolic at 98/64.  He denies any dizziness, lightheadedness, presyncopal or syncopal episodes.  States his blood pressure has been a little low since the cardiac catheterization.  Denies any PND, orthopnea, bleeding, CVA or TIA-like symptoms.  No claudication-like symptoms, DVT or PE-like symptoms, or lower extremity edema.  States he is trying to modify his lifestyle by eating better but it is not easy.  Current cardiac regimen: Aspirin 81 mg daily, atorvastatin 80 mg daily, carvedilol 3.125 mg p.o. twice daily.  Nitroglycerin 0.4 mg sublingual as needed.  Brilinta 90 mg p.o. twice daily.  Past Medical History:  Diagnosis Date  . CAD (coronary artery disease)   . Cocaine use   . Ischemic cardiomyopathy   . NSTEMI (non-ST elevated myocardial infarction) (HCC)   . Tobacco use     Past Surgical History:  Procedure Laterality Date  . CORONARY ANGIOGRAPHY N/A 07/11/2020   Procedure: CORONARY ANGIOGRAPHY;  Surgeon: Lennette Bihari, MD;  Location: [facility] INVASIVE CV LAB;  Service: Cardiovascular;  Laterality: N/A;  . CORONARY STENT INTERVENTION N/A 07/11/2020   Procedure: CORONARY STENT INTERVENTION;  Surgeon: Lennette Bihari, MD;  Location: [facility] INVASIVE CV LAB;  Service: Cardiovascular;  Laterality: N/A;  . INTRAVASCULAR ULTRASOUND/IVUS N/A 07/11/2020   Procedure: Intravascular Ultrasound/IVUS;  Surgeon: Lennette Bihari, MD;  Location: [facility] INVASIVE CV LAB;  Service: Cardiovascular;  Laterality: N/A;  . LEFT HEART CATH AND CORONARY ANGIOGRAPHY N/A 07/09/2020   Procedure: LEFT  HEART CATH AND CORONARY ANGIOGRAPHY;  Surgeon: Lennette Bihari, MD;  Location: [facility] INVASIVE CV LAB;  Service: Cardiovascular;  Laterality: N/A;    Current Outpatient Medications  Medication Sig Dispense Refill  . aspirin EC 81 MG EC tablet Take 1 tablet (81 mg total) by mouth daily. Swallow  whole. 30 tablet 11  . atorvastatin (LIPITOR) 80 MG tablet Take 1 tablet (80 mg total) by mouth every evening. 30 tablet 6  . carvedilol (COREG) 3.125 MG tablet Take 1 tablet (3.125 mg total) by mouth 2 (two) times daily with a meal. 60 tablet 6  . diphenhydramine-acetaminophen (TYLENOL PM) 25-500 MG TABS tablet Take 1 tablet by mouth at bedtime as needed.    . nitroGLYCERIN (NITROSTAT) 0.4 MG SL tablet Place 1 tablet (0.4 mg total) under the tongue every 5 (five) minutes as needed for chest pain (up to 3 doses). 25 tablet 3  . ticagrelor (BRILINTA) 90 MG TABS tablet Take 1 tablet (90 mg total) by mouth 2 (two) times daily. 60 tablet 11   No current facility-administered medications for this visit.   Allergies:  Billy Briggs has no known allergies.   Social History: The Billy Briggs  reports that he has been smoking. He has never used smokeless tobacco. He reports current alcohol use. He reports current drug use.   Family History: The Billy Briggs's family history includes Cancer in his mother.   ROS:  Please see the history of present illness. Otherwise, complete review of systems is positive for none.  All other systems are reviewed and negative.   Physical Exam: VS:  BP 98/64   Pulse 74   Ht 6\' 1"  (1.854 m)   Wt 187 lb 9.6 oz (85.1 kg)   SpO2 96%   BMI 24.75 kg/m , BMI Body mass index is 24.75 kg/m.  Wt Readings from Last 3 Encounters:  07/20/20 187 lb 9.6 oz (85.1 kg)  07/11/20 189 lb 13.1 oz (86.1 kg)    General: Billy Briggs appears comfortable at rest. Neck: Supple, no elevated JVP or carotid bruits, no thyromegaly. Lungs: Clear to auscultation, nonlabored breathing at rest. Cardiac: Regular rate and rhythm, no S3 or significant systolic murmur, no pericardial rub. Extremities: No pitting edema, distal pulses 2+. Skin: Warm and dry. Musculoskeletal: No kyphosis. Neuropsychiatric: Alert and oriented x3, affect grossly appropriate.  ECG:  07/08/2020 EKG shows normal sinus rhythm with sinus  arrhythmia, anteroseptal infarct, age undetermined.  Heart rate of 87.  Recent Labwork: 07/09/2020: TSH 1.844 07/10/2020: Magnesium 1.9 07/11/2020: ALT 32; AST 37 07/12/2020: BUN 11; Creatinine, Ser 1.14; Hemoglobin 13.5; Platelets 220; Potassium 3.8; Sodium 137     Component Value Date/Time   CHOL 117 07/09/2020 0751   TRIG 99 07/09/2020 0751   HDL 44 07/09/2020 0751   CHOLHDL 2.7 07/09/2020 0751   VLDL 20 07/09/2020 0751   LDLCALC 53 07/09/2020 0751    Other Studies Reviewed Today:   PCI 07/11/20  Ost LAD to Prox LAD lesion is 85% stenosed.  Post intervention, there is a 0% residual stenosis.  A stent was successfully placed.  Difficult but successful percutaneous coronary intervention of the LAD ostium utilizing intravascular ultrasound with ultimate insertion of a 4.5 x 18 mm Resolute Onyx stent inserted at the ostium extending beyond the diagonal vessel, postdilated to 4.6 mm with resolution of thrombus burden and the stenosis being reduced from 85% to 0% and brisk TIMI-3 flow.  RECOMMENDATION: DAPT for minimum of 1 year. Will discontinue Aggrastat post procedure. Aggressive lipid-lowering therapy with  target LDL less than 70. Billy Briggs was strongly advised to DC his mild tobacco use and never take cocaine again.  2D echo 07/09/20  IMPRESSIONS    1. Left ventricular ejection fraction, by estimation, is 35 to 40%. The  left ventricle has moderately decreased function. The left ventricle  demonstrates regional wall motion abnormalities (see scoring  diagram/findings for description). The left  ventricular internal cavity size was mildly to moderately dilated. Left  ventricular diastolic parameters are indeterminate. There is severe  akinesis of the left ventricular, mid-apical anteroseptal wall.  2. Right ventricular systolic function is normal. The right ventricular  size is normal. There is normal pulmonary artery systolic pressure.  3. The mitral valve is normal in  structure. No evidence of mitral valve  regurgitation. No evidence of mitral stenosis.  4. The aortic valve is grossly normal. Aortic valve regurgitation is not  visualized. No aortic stenosis is present.   Diagnostic Cath 07/09/20  Ost LAD to Prox LAD lesion is 85% stenosed.  There is severe left ventricular systolic dysfunction.  LV end diastolic pressure is moderately elevated.  The left ventricular ejection fraction is 25-35% by visual estimate.  Acute coronary syndrome secondary to probable plaque rupture at the LAD ostium with thrombus. Depending upon the view the stenosis ranges from 50% up to 85%. There is brisk TIMI-3 flow.  Normal left circumflex, ramus intermediate, and dominant RCA.  Acute ischemic cardiomyopathy with EF estimate approximately 25% with extensive wall motion abnormality involving the distribution of the LAD extending from the upper mid anterolateral wall around the apex to the mid inferior wall. Basal contraction is vigorous.  Aggrastat initiation for significant thrombus at the LAD ostium.  RECOMMENDATION: The Billy Briggs has otherwise pristine appearing coronary arteries. Suspect acute plaque rupture of mild plaque at the LAD ostium which may have been contributed by the Billy Briggs's recent use of cocaine leading to potential coronary vasospasm. There is no nubbin at the LAD ostium and the thrombus extends to the distal left main. Since TIMI-3 flow is present and the Billy Briggs is pain-free, will initially plan Aggrastat infusion for minimum of 48 hours with continuation of heparin post TR band removal and attempt to resolve clot burden with plans to reassess LAD lesion for need for potential PCI with stenting up to the left main versus CABG. Alternatively if thrombus is completely cleared intervention may not be necessary.  Assessment and Plan:  1. NSTEMI (non-ST elevated myocardial infarction) (HCC)   2. Cocaine abuse (HCC)   3. Ischemic cardiomyopathy    4. Tobacco abuse   5. CAD in native artery   6. AKI (acute kidney injury) (HCC)    1. NSTEMI (non-ST elevated myocardial infarction) (HCC) Recent acute coronary syndrome secondary to probable plaque rupture in LAD ostium with thrombus.  Suspected acute plaque rupture of mild plaque in LAD which may have been contributed by the Billy Briggs's recent use of cocaine leading to potential coronary artery vasospasm. Difficult but successful percutaneous coronary intervention of the LAD ostium utilizing intravascular ultrasound with ultimate insertion of a 4.5 x 18 mm Resolute Onyx stent inserted at the ostium extending beyond the diagonal vessel, postdilated to 4.6 mm with resolution of thrombus burden and the stenosis being reduced from 85% to 0% and brisk TIMI-3 flow.  2. Cocaine abuse (HCC) Recent urine drug screen showed positive for cocaine.  Advised cessation of all stimulants/recreational substances.  3. Ischemic cardiomyopathy Recent echocardiogram on 9/5/2021Estimated LVEF 35 to 40%, positive for WMA's.  LV internal  cavity size mildly to moderately dilated.  Severe akinesis of left ventricular, mid apical anterior septal wall.  Repeat echocardiogram in 3 months to reassess LV function.  4. Tobacco abuse Advised cessation.  5. CAD in native artery Diagnostic cardiac catheterization 07/09/2020 showed ostial LAD to proximal LAD 85% stenosis.  Severe LV systolic dysfunction.  LV end-diastolic pressure moderately elevated.  LV EF 25 to 35% visual estimate  6. AKI (acute kidney injury) (HCC) Recent creatinine 1.14 with GFR greater than 60.   7.  Hyperlipidemia. Recent lipid panel 07/09/2020: TC 117, TG 99, HDL 44, LDL 53.  Continue atorvastatin 80 mg daily.  Repeat FLP's and LFTs in 6 to 8 weeks.  Medication Adjustments/Labs and Tests Ordered: Current medicines are reviewed at length with the Billy Briggs today.  Concerns regarding medicines are outlined above.   Disposition: Follow-up with Dr. Mayford Knifeurner  or APP in 3 months.  Signed, Rennis HardingAndrew Quinn, NP 07/20/2020 3:23 PM    [facility] [address], [place], [place] [address] Phone: [phone]) [phone]; Fax: [phone]

## 2020-07-20 ENCOUNTER — Ambulatory Visit (INDEPENDENT_AMBULATORY_CARE_PROVIDER_SITE_OTHER): Payer: BC Managed Care – PPO | Admitting: Family Medicine

## 2020-07-20 ENCOUNTER — Encounter: Payer: Self-pay | Admitting: Family Medicine

## 2020-07-20 ENCOUNTER — Ambulatory Visit: Payer: BC Managed Care – PPO | Admitting: Cardiology

## 2020-07-20 ENCOUNTER — Other Ambulatory Visit: Payer: Self-pay

## 2020-07-20 ENCOUNTER — Encounter: Payer: Self-pay | Admitting: *Deleted

## 2020-07-20 VITALS — BP 98/64 | HR 74 | Ht 73.0 in | Wt 187.6 lb

## 2020-07-20 DIAGNOSIS — I255 Ischemic cardiomyopathy: Secondary | ICD-10-CM | POA: Diagnosis not present

## 2020-07-20 DIAGNOSIS — N179 Acute kidney failure, unspecified: Secondary | ICD-10-CM

## 2020-07-20 DIAGNOSIS — Z720 Tobacco use: Secondary | ICD-10-CM

## 2020-07-20 DIAGNOSIS — F1410 Cocaine abuse, uncomplicated: Secondary | ICD-10-CM

## 2020-07-20 DIAGNOSIS — I214 Non-ST elevation (NSTEMI) myocardial infarction: Secondary | ICD-10-CM | POA: Diagnosis not present

## 2020-07-20 DIAGNOSIS — I2510 Atherosclerotic heart disease of native coronary artery without angina pectoris: Secondary | ICD-10-CM

## 2020-07-20 NOTE — Patient Instructions (Signed)
Medication Instructions:  Continue all current medications.  Labwork:  FLP, LFT - orders given today.   Please do in 6-8 weeks, around 10/28).  Reminder:  Nothing to eat or drink after 12 midnight prior to labs.  Office will contact with results via phone or letter.    Testing/Procedures: Your physician has requested that you have an echocardiogram. Echocardiography is a painless test that uses sound waves to create images of your heart. It provides your doctor with information about the size and shape of your heart and how well your heart's chambers and valves are working. This procedure takes approximately one hour. There are no restrictions for this procedure - TO BE DONE JUST PRIOR TO NEXT OFFICE VISIT  Follow-Up: 3 months   Any Other Special Instructions Will Be Listed Below (If Applicable).  If you need a refill on your cardiac medications before your next appointment, please call your pharmacy.

## 2020-09-02 LAB — HEPATIC FUNCTION PANEL
AG Ratio: 1.9 (calc) (ref 1.0–2.5)
ALT: 70 U/L — ABNORMAL HIGH (ref 9–46)
AST: 47 U/L — ABNORMAL HIGH (ref 10–35)
Albumin: 4.4 g/dL (ref 3.6–5.1)
Alkaline phosphatase (APISO): 113 U/L (ref 35–144)
Bilirubin, Direct: 0.2 mg/dL (ref 0.0–0.2)
Globulin: 2.3 g/dL (calc) (ref 1.9–3.7)
Indirect Bilirubin: 0.6 mg/dL (calc) (ref 0.2–1.2)
Total Bilirubin: 0.8 mg/dL (ref 0.2–1.2)
Total Protein: 6.7 g/dL (ref 6.1–8.1)

## 2020-09-02 LAB — LIPID PANEL
Cholesterol: 91 mg/dL (ref <200)
HDL: 37 mg/dL — ABNORMAL LOW (ref >=40)
LDL Cholesterol (Calc): 33 mg/dL (calc)
Non-HDL Cholesterol (Calc): 54 mg/dL (calc) (ref <130)
Total CHOL/HDL Ratio: 2.5 (calc) (ref <5.0)
Triglycerides: 128 mg/dL (ref <150)

## 2020-09-05 ENCOUNTER — Telehealth: Payer: Self-pay | Admitting: *Deleted

## 2020-09-05 NOTE — Telephone Encounter (Signed)
-----   Message from Netta Neat., NP sent at 09/03/2020  8:12 PM EDT ----- Lipid panel looks ok. ALT is 47 and ALT 70 both elevated. Decrease atorvastatin to 40 mg po daily. Get Lipid and liver function repeat labs in 6 - 8 weeks to re-check.

## 2020-09-05 NOTE — Telephone Encounter (Signed)
Lesle Chris, LPN  41/04/6062 0:16 AM EDT Back to Top    Left message to return call.

## 2020-09-07 NOTE — Telephone Encounter (Signed)
New message     Patient is returning call for results

## 2020-09-08 NOTE — Telephone Encounter (Signed)
Lesle Chris, LPN  38/12/5051 9:76 PM EDT Back to Top    2nd attempt - lmtcb.

## 2020-09-11 MED ORDER — ATORVASTATIN CALCIUM 80 MG PO TABS: 40.0000 mg | ORAL_TABLET | Freq: Every evening | ORAL | Status: DC

## 2020-09-11 NOTE — Telephone Encounter (Signed)
Lesle Chris, LPN  32/11/2246 25:00 AM EST Back to Top    Patient notified. Will mail lab order when time to repeat. He will use [facility] across from [facility].

## 2020-09-21 ENCOUNTER — Other Ambulatory Visit: Payer: BC Managed Care – PPO

## 2020-09-21 DIAGNOSIS — Z20822 Contact with and (suspected) exposure to covid-19: Secondary | ICD-10-CM

## 2020-09-22 LAB — NOVEL CORONAVIRUS, NAA: SARS-CoV-2, NAA: DETECTED — AB

## 2020-09-22 LAB — SARS-COV-2, NAA 2 DAY TAT

## 2020-09-23 ENCOUNTER — Encounter: Payer: Self-pay | Admitting: Unknown Physician Specialty

## 2020-09-23 ENCOUNTER — Telehealth: Payer: Self-pay | Admitting: Unknown Physician Specialty

## 2020-09-23 ENCOUNTER — Other Ambulatory Visit: Payer: Self-pay | Admitting: Unknown Physician Specialty

## 2020-09-23 ENCOUNTER — Ambulatory Visit (HOSPITAL_COMMUNITY)
Admission: RE | Admit: 2020-09-23 | Discharge: 2020-09-23 | Disposition: A | Discharge status: Home / Self Care | Payer: BC Managed Care – PPO | Source: Ambulatory Visit | Attending: Pulmonary Disease | Admitting: Pulmonary Disease

## 2020-09-23 ENCOUNTER — Telehealth (HOSPITAL_COMMUNITY): Payer: Self-pay

## 2020-09-23 DIAGNOSIS — I214 Non-ST elevation (NSTEMI) myocardial infarction: Secondary | ICD-10-CM

## 2020-09-23 DIAGNOSIS — I255 Ischemic cardiomyopathy: Secondary | ICD-10-CM

## 2020-09-23 DIAGNOSIS — U071 COVID-19: Secondary | ICD-10-CM | POA: Insufficient documentation

## 2020-09-23 MED ORDER — SODIUM CHLORIDE 0.9 % IV SOLN: INTRAVENOUS | Status: DC | PRN

## 2020-09-23 MED ORDER — FAMOTIDINE IN NACL 20-0.9 MG/50ML-% IV SOLN: 20.0000 mg | Freq: Once | INTRAVENOUS | Status: DC | PRN

## 2020-09-23 MED ORDER — ALBUTEROL SULFATE HFA 108 (90 BASE) MCG/ACT IN AERS: 2.0000 | INHALATION_SPRAY | Freq: Once | RESPIRATORY_TRACT | Status: DC | PRN

## 2020-09-23 MED ORDER — DIPHENHYDRAMINE HCL 50 MG/ML IJ SOLN: 50.0000 mg | Freq: Once | INTRAMUSCULAR | Status: DC | PRN

## 2020-09-23 MED ORDER — METHYLPREDNISOLONE SODIUM SUCC 125 MG IJ SOLR: 125.0000 mg | Freq: Once | INTRAMUSCULAR | Status: DC | PRN

## 2020-09-23 MED ORDER — SOTROVIMAB 500 MG/8ML IV SOLN
500.0000 mg | Freq: Once | INTRAVENOUS | Status: AC
  Administered 2020-09-23: 500 mg via INTRAVENOUS

## 2020-09-23 MED ORDER — EPINEPHRINE 0.3 MG/0.3ML IJ SOAJ: 0.3000 mg | Freq: Once | INTRAMUSCULAR | Status: DC | PRN

## 2020-09-23 NOTE — Discharge Instructions (Signed)
10 Things You Can Do to Manage Your COVID-19 Symptoms at Home If you have possible or confirmed COVID-19: 1. Stay home from work and school. And stay away from other public places. If you must go out, avoid using any kind of public transportation, ridesharing, or taxis. 2. Monitor your symptoms carefully. If your symptoms get worse, call your healthcare provider immediately. 3. Get rest and stay hydrated. 4. If you have a medical appointment, call the healthcare provider ahead of time and tell them that you have or may have COVID-19. 5. For medical emergencies, call 911 and notify the dispatch personnel that you have or may have COVID-19. 6. Cover your cough and sneezes with a tissue or use the inside of your elbow. 7. Wash your hands often with soap and water for at least 20 seconds or clean your hands with an alcohol-based hand sanitizer that contains at least 60% alcohol. 8. As much as possible, stay in a specific room and away from other people in your home. Also, you should use a separate bathroom, if available. If you need to be around other people in or outside of the home, wear a mask. 9. Avoid sharing personal items with other people in your household, like dishes, towels, and bedding. 10. Clean all surfaces that are touched often, like counters, tabletops, and doorknobs. Use household cleaning sprays or wipes according to the label instructio 10 Things You Can Do to Manage Your COVID-19 Symptoms at Home If you have possible or confirmed COVID-19: Stay home from work and school. And stay away from other public places. If you must go out, avoid using any kind of public transportation, ridesharing, or taxis. Monitor your symptoms carefully. If your symptoms get worse, call your healthcare provider immediately. Get rest and stay hydrated. If you have a medical appointment, call the healthcare provider ahead of time and tell them that you have or may have COVID-19. For medical emergencies,  call 911 and notify the dispatch personnel that you have or may have COVID-19. Cover your cough and sneezes with a tissue or use the inside of your elbow. Wash your hands often with soap and water for at least 20 seconds or clean your hands with an alcohol-based hand sanitizer that contains at least 60% alcohol. As much as possible, stay in a specific room and away from other people in your home. Also, you should use a separate bathroom, if available. If you need to be around other people in or outside of the home, wear a mask. Avoid sharing personal items with other people in your household, like dishes, towels, and bedding. Clean all surfaces that are touched often, like counters, tabletops, and doorknobs. Use household cleaning sprays or wipes according to the label instructions. [employer] 05/05/2019 This information is not intended to replace advice given to you by your health care provider. Make sure you discuss any questions you have with your health care provider. Document Revised: 10/07/2019 Document Reviewed: 10/07/2019 Elsevier Patient Education  2020 [employer]. What types of side effects do monoclonal antibody drugs cause?  Common side effects  In general, the more common side effects caused by monoclonal antibody drugs include: Allergic reactions, such as hives or itching Flu-like signs and symptoms, including chills, fatigue, fever, and muscle aches and pains Nausea, vomiting Diarrhea Skin rashes Low blood pressure   The CDC is recommending patients who receive monoclonal antibody treatments wait at least 90 days before being vaccinated.  11. Currently, there are no data on the  safety and efficacy of mRNA COVID-19 vaccines in persons who received monoclonal antibodies or convalescent plasma as part of COVID-19 treatment. Based on the estimated half-life of such therapies as well as evidence suggesting that reinfection is uncommon in the 90 days after initial  infection, vaccination should be deferred for at least 90 days, as a precautionary measure until additional information becomes available, to avoid interference of the antibody treatment with vaccine-induced immune responses.  If you have any questions or concerns after the infusion please call the Advanced Practice Provider on call at [phone]

## 2020-09-23 NOTE — Telephone Encounter (Signed)
I connected by phone with Billy Briggs on 09/23/2020 at 10:35 AM to discuss the potential use of a new treatment for mild to moderate COVID-19 viral infection in non-hospitalized patients.  This patient is a 54 y.o. male that meets the FDA criteria for Emergency Use Authorization of COVID monoclonal antibody casirivimab/imdevimab, bamlanivimab/eteseviamb, or sotrovimab.  Has a (+) direct SARS-CoV-2 viral test result  Has mild or moderate COVID-19   Is NOT hospitalized due to COVID-19  Is within 10 days of symptom onset  Has at least one of the high risk factor(s) for progression to severe COVID-19 and/or hospitalization as defined in EUA.  Specific high risk criteria : Cardiovascular disease or hypertension   I have spoken and communicated the following to the patient or parent/caregiver regarding COVID monoclonal antibody treatment:  1. FDA has authorized the emergency use for the treatment of mild to moderate COVID-19 in adults and pediatric patients with positive results of direct SARS-CoV-2 viral testing who are 57 years of age and older weighing at least 40 kg, and who are at high risk for progressing to severe COVID-19 and/or hospitalization.  2. The significant known and potential risks and benefits of COVID monoclonal antibody, and the extent to which such potential risks and benefits are unknown.  3. Information on available alternative treatments and the risks and benefits of those alternatives, including clinical trials.  4. Patients treated with COVID monoclonal antibody should continue to self-isolate and use infection control measures (e.g., wear mask, isolate, social distance, avoid sharing personal items, clean and disinfect high touch surfaces, and frequent handwashing) according to CDC guidelines.   5. The patient or parent/caregiver has the option to accept or refuse COVID monoclonal antibody treatment.  After reviewing this information with the patient, the patient  has agreed to receive one of the available covid 19 monoclonal antibodies and will be provided an appropriate fact sheet prior to infusion.   Billy Cirri, NP 09/23/2020 10:35 AM  Sx onset 11/17

## 2020-09-23 NOTE — Progress Notes (Addendum)
  Diagnosis: COVID-19  Physician: Dr. Wright  Procedure: Sotrovimab  Complications: No immediate complications noted.  Discharge: Discharged home   Billy Briggs 09/23/2020  

## 2020-09-23 NOTE — Progress Notes (Signed)
Patient reviewed Fact Sheet for Patients, Parents, and Caregivers for Emergency Use Authorization (EUA) of Sotrovimab for the Treatment of Coronavirus. Patient also reviewed and is agreeable to the estimated cost of treatment. Patient is agreeable to proceed.   

## 2020-10-03 NOTE — Telephone Encounter (Signed)
done

## 2020-10-12 ENCOUNTER — Ambulatory Visit (INDEPENDENT_AMBULATORY_CARE_PROVIDER_SITE_OTHER): Payer: BC Managed Care – PPO

## 2020-10-12 DIAGNOSIS — I255 Ischemic cardiomyopathy: Secondary | ICD-10-CM | POA: Diagnosis not present

## 2020-10-12 LAB — ECHOCARDIOGRAM COMPLETE
Area-P 1/2: 3.77 cm2
Calc EF: 61.1 %
S' Lateral: 3.97 cm
Single Plane A2C EF: 48.4 %
Single Plane A4C EF: 69.9 %

## 2020-10-16 ENCOUNTER — Telehealth: Payer: Self-pay | Admitting: *Deleted

## 2020-10-16 NOTE — Telephone Encounter (Signed)
-----   Message from Netta Neat., NP sent at 10/13/2020  8:00 AM EST ----- Please call the patient and let him know the pumping function of his heart has improved and is now back to normal.  He has mildly leaking mitral and tricuspid valves.  Otherwise everything looks good

## 2020-10-16 NOTE — Telephone Encounter (Signed)
Patient informed. 

## 2020-10-17 NOTE — Progress Notes (Signed)
Cardiology Office Note  Date: 10/19/2020   ID: Billy FarmLarry Shoff, DOB Jul 07, 1966, MRN [id number]  PCP:  Patient, No Pcp Per  Cardiologist:  Armanda Magicraci Turner, MD Electrophysiologist:  None   Chief Complaint: Hospital follow-up NSTEMI  History of Present Illness: Billy Briggs is a 54 y.o. male with a history of NSTEMI, cocaine abuse, ischemic cardiomyopathy, CAD, tobacco use, acute kidney injury.  [facility] pain and elevated troponins.  Complaining of left-sided chest pain burning with radiation to left arm, had associated DOE, nausea,  and vomiting.  Had a similar episode a week prior lasting about an hour.  His troponins were elevated at 2586--5359.  WBC 11.2, D-dimer normal, creatinine 1.14, Covid negative, EKG diffuse ST/T wave abnormalities in anterior infarct.  UDS positive for cocaine.  Cardiac catheterization 07/09/2020 showing probable plaque rupture at LAD ostium with thrombus.  Stenosis ranged from 50% up to 85%.  LVEF was approximately 25%.  Otherwise normal coronary arteries.  Suspected acute plaque rupture due to recent cocaine use with potential coronary artery vasospasm.  Echocardiogram 07/10/2019.  Had a relook catheterization showing 85% LAD stenosis.  Difficult but successful PCI to LAD with insertion of 4.5 x 18 mm resolute Onyx stent.  Discharged on aspirin, Brilinta, statin, and very low-dose beta-blocker.  Recommendations were if patient was tolerating statin at time of follow-up appointment would consider rechecking LFTs in FLP's in 6 weeks.  Recommended follow-up echocardiogram in 3 months after medications titrated and if EF remains less than 35% we will need to proceed with ICD.  Patient would need to demonstrate he could avoid cocaine use prior to device placement.  His creatinine on arrival was 1.14.  It peaked at 1.57.  It improved to 1.14 prior to discharge.  Patient was here last visit for post PCI follow-up.  He  denied any anginal or exertional symptoms.  Blood pressure was below 100 systolic at 98/64.  He denied dizziness, lightheadedness, presyncopal or syncopal episodes.  States his blood pressure had been a little low since the cardiac catheterization.  Denies any PND, orthopnea, bleeding, CVA or TIA-like symptoms.  No claudication-like symptoms, DVT or PE-like symptoms, or lower extremity edema.  Stated he was trying to modify his lifestyle by eating better but it is not easy.  Current cardiac regimen: Aspirin 81 mg daily, atorvastatin 80 mg daily, carvedilol 3.125 mg p.o. twice daily.  Nitroglycerin 0.4 mg sublingual as needed.  Brilinta 90 mg p.o. twice daily.   He is here for follow-up status post repeat echocardiogram.  LV systolic function has significantly improved 55-60% from previous echo showing EF of 35 to 40%.  States he is feeling much better in spite of having Covid infection around Thanksgiving.  He mentions that he has fully recovered.  He denies any anginal or exertional symptoms, palpitations or arrhythmias, orthostatic symptoms, CVA or TIA-like symptoms, PND, orthopnea, bleeding, claudication, DVT/PE-like symptoms, or lower extremity edema.  He admits to some lower extremity itching.  States he did not have this itching prior recent catheterization.  Otherwise he denies any issues.  Past Medical History:  Diagnosis Date  . CAD (coronary artery disease)   . Cocaine use   . Ischemic cardiomyopathy   . NSTEMI (non-ST elevated myocardial infarction) (HCC)   . Tobacco use     Past Surgical History:  Procedure Laterality Date  . CORONARY ANGIOGRAPHY N/A 07/11/2020   Procedure: CORONARY ANGIOGRAPHY;  Surgeon: Lennette BihariKelly, Thomas A, MD;  Location: [facility] INVASIVE CV LAB;  Service: Cardiovascular;  Laterality: N/A;  . CORONARY STENT INTERVENTION N/A 07/11/2020   Procedure: CORONARY STENT INTERVENTION;  Surgeon: Lennette Bihari, MD;  Location: [facility] INVASIVE CV LAB;  Service: Cardiovascular;  Laterality: N/A;   . INTRAVASCULAR ULTRASOUND/IVUS N/A 07/11/2020   Procedure: Intravascular Ultrasound/IVUS;  Surgeon: Lennette Bihari, MD;  Location: [facility] INVASIVE CV LAB;  Service: Cardiovascular;  Laterality: N/A;  . LEFT HEART CATH AND CORONARY ANGIOGRAPHY N/A 07/09/2020   Procedure: LEFT HEART CATH AND CORONARY ANGIOGRAPHY;  Surgeon: Lennette Bihari, MD;  Location: [facility] INVASIVE CV LAB;  Service: Cardiovascular;  Laterality: N/A;    Current Outpatient Medications  Medication Sig Dispense Refill  . aspirin EC 81 MG EC tablet Take 1 tablet (81 mg total) by mouth daily. Swallow whole. 30 tablet 11  . atorvastatin (LIPITOR) 80 MG tablet Take 0.5 tablets (40 mg total) by mouth every evening.    . carvedilol (COREG) 3.125 MG tablet Take 1 tablet (3.125 mg total) by mouth 2 (two) times daily with a meal. 60 tablet 6  . diphenhydramine-acetaminophen (TYLENOL PM) 25-500 MG TABS tablet Take 1 tablet by mouth at bedtime as needed.    . nitroGLYCERIN (NITROSTAT) 0.4 MG SL tablet Place 1 tablet (0.4 mg total) under the tongue every 5 (five) minutes as needed for chest pain (up to 3 doses). 25 tablet 3  . ticagrelor (BRILINTA) 90 MG TABS tablet Take 1 tablet (90 mg total) by mouth 2 (two) times daily. 60 tablet 11   No current facility-administered medications for this visit.   Allergies:  Patient has no known allergies.   Social History: The patient  reports that he has been smoking. He has never used smokeless tobacco. He reports current alcohol use. He reports current drug use.   Family History: The patient's family history includes Cancer in his mother.   ROS:  Please see the history of present illness. Otherwise, complete review of systems is positive for none.  All other systems are reviewed and negative.   Physical Exam: VS:  BP 100/62   Pulse 63   Ht 6\' 1"  (1.854 m)   Wt 191 lb (86.6 kg)   SpO2 98%   BMI 25.20 kg/m , BMI Body mass index is 25.2 kg/m.  Wt Readings from Last 3 Encounters:  10/19/20 191 lb  (86.6 kg)  07/20/20 187 lb 9.6 oz (85.1 kg)  07/11/20 189 lb 13.1 oz (86.1 kg)    General: Patient appears comfortable at rest. Neck: Supple, no elevated JVP or carotid bruits, no thyromegaly. Lungs: Clear to auscultation, nonlabored breathing at rest. Cardiac: Regular rate and rhythm, no S3 or significant systolic murmur, no pericardial rub. Extremities: No pitting edema, distal pulses 2+. Skin: Warm and dry. Musculoskeletal: No kyphosis. Neuropsychiatric: Alert and oriented x3, affect grossly appropriate.  ECG:  07/08/2020 EKG shows normal sinus rhythm with sinus arrhythmia, anteroseptal infarct, age undetermined.  Heart rate of 87.  Recent Labwork: 07/09/2020: TSH 1.844 07/10/2020: Magnesium 1.9 07/12/2020: BUN 11; Creatinine, Ser 1.14; Hemoglobin 13.5; Platelets 220; Potassium 3.8; Sodium 137 09/01/2020: ALT 70; AST 47     Component Value Date/Time   CHOL 91 09/01/2020 0725   TRIG 128 09/01/2020 0725   HDL 37 (L) 09/01/2020 0725   CHOLHDL 2.5 09/01/2020 0725   VLDL 20 07/09/2020 0751   LDLCALC 33 09/01/2020 0725    Other Studies Reviewed Today:  Echocardiogram 10/12/2020 1. Left ventricular ejection fraction, by estimation, is 55 to 60%.  The left ventricle has normal function. The left ventricle has no regional wall motion abnormalities. Left ventricular diastolic parameters were normal. Normal global longitudinal strain of -19.5%. 2. Right ventricular systolic function is normal. The right ventricular size is normal. There is mildly elevated pulmonary artery systolic pressure. The estimated right ventricular systolic pressure is 36.5 mmHg. 3. The mitral valve is grossly normal. Mild mitral valve regurgitation. 4. The aortic valve is tricuspid. Aortic valve regurgitation is not visualized. 5. The inferior vena cava is normal in size with <50% respiratory variability, suggesting right atrial pressure of 8 mmHg. Comparison(s): Previous Echo showed LV EF 35-40%, severe AK of  mid-apical anteroseptal wall, moderate LV dilatation.   PCI 07/11/20  Ost LAD to Prox LAD lesion is 85% stenosed.  Post intervention, there is a 0% residual stenosis.  A stent was successfully placed.  Difficult but successful percutaneous coronary intervention of the LAD ostium utilizing intravascular ultrasound with ultimate insertion of a 4.5 x 18 mm Resolute Onyx stent inserted at the ostium extending beyond the diagonal vessel, postdilated to 4.6 mm with resolution of thrombus burden and the stenosis being reduced from 85% to 0% and brisk TIMI-3 flow.  RECOMMENDATION: DAPT for minimum of 1 year. Will discontinue Aggrastat post procedure. Aggressive lipid-lowering therapy with target LDL less than 70. Patient was strongly advised to DC his mild tobacco use and never take cocaine again.  2D echo 07/09/20  IMPRESSIONS  1. Left ventricular ejection fraction, by estimation, is 35 to 40%. The  left ventricle has moderately decreased function. The left ventricle  demonstrates regional wall motion abnormalities (see scoring  diagram/findings for description). The left  ventricular internal cavity size was mildly to moderately dilated. Left  ventricular diastolic parameters are indeterminate. There is severe  akinesis of the left ventricular, mid-apical anteroseptal wall.  2. Right ventricular systolic function is normal. The right ventricular  size is normal. There is normal pulmonary artery systolic pressure.  3. The mitral valve is normal in structure. No evidence of mitral valve  regurgitation. No evidence of mitral stenosis.  4. The aortic valve is grossly normal. Aortic valve regurgitation is not  visualized. No aortic stenosis is present.   Diagnostic Cath 07/09/20  Ost LAD to Prox LAD lesion is 85% stenosed.  There is severe left ventricular systolic dysfunction.  LV end diastolic pressure is moderately elevated.  The left ventricular ejection fraction is 25-35% by  visual estimate.  Acute coronary syndrome secondary to probable plaque rupture at the LAD ostium with thrombus. Depending upon the view the stenosis ranges from 50% up to 85%. There is brisk TIMI-3 flow.  Normal left circumflex, ramus intermediate, and dominant RCA.  Acute ischemic cardiomyopathy with EF estimate approximately 25% with extensive wall motion abnormality involving the distribution of the LAD extending from the upper mid anterolateral wall around the apex to the mid inferior wall. Basal contraction is vigorous.  Aggrastat initiation for significant thrombus at the LAD ostium.  RECOMMENDATION: The patient has otherwise pristine appearing coronary arteries. Suspect acute plaque rupture of mild plaque at the LAD ostium which may have been contributed by the patient's recent use of cocaine leading to potential coronary vasospasm. There is no nubbin at the LAD ostium and the thrombus extends to the distal left main. Since TIMI-3 flow is present and the patient is pain-free, will initially plan Aggrastat infusion for minimum of 48 hours with continuation of heparin post TR band removal and attempt to resolve clot burden with plans to  reassess LAD lesion for need for potential PCI with stenting up to the left main versus CABG. Alternatively if thrombus is completely cleared intervention may not be necessary.  Assessment and Plan:   1. NSTEMI (non-ST elevated myocardial infarction) (HCC) Recent acute coronary syndrome secondary to probable plaque rupture in LAD ostium with thrombus.  Suspected acute plaque rupture of mild plaque in LAD which may have been contributed by the patient's recent use of cocaine leading to potential coronary artery vasospasm. Difficult but successful percutaneous coronary intervention of the LAD ostium utilizing intravascular ultrasound with ultimate insertion of a 4.5 x 18 mm Resolute Onyx stent inserted at the ostium extending beyond the diagonal  vessel, postdilated to 4.6 mm with resolution of thrombus burden and the stenosis being reduced from 85% to 0% and brisk TIMI-3 flow.  2. Cocaine abuse (HCC) Recent urine drug screen showed positive for cocaine.  Advised cessation of all stimulants/recreational substances.  No current use  3. Ischemic cardiomyopathy Recent echocardiogram on 07/09/2020. Estimated LVEF 35 to 40%, positive for WMA's.  LV internal cavity size mildly to moderately dilated.  Severe akinesis of left ventricular, mid apical anterior septal wall.  Repeat echocardiogram 10/12/2020 has shown significant improvement in EF at 55 to 60%.  No WMA's, mildly elevated PASP 36.5 mmHg, mild MR.  4. Tobacco abuse Advised cessation.  Verbalized understanding  5. CAD in native artery Diagnostic cardiac catheterization 07/09/2020 showed ostial LAD to proximal LAD 85% stenosis.  Severe LV systolic dysfunction.  LV end-diastolic pressure moderately elevated.  LVEF 25 to 35% visual estimate.  Repeat echo 10/12/2020 EF 55 to 60%.  No recent anginal or exertional symptoms.  Continue aspirin 81 mg daily, Brilinta 90 mg p.o. twice daily, sublingual nitroglycerin as needed  6. AKI (acute kidney injury) (HCC) Recent creatinine 1.14 with GFR greater than 60.   7.  Hyperlipidemia. Continue atorvastatin 80 mg daily.  Recent lipid panel 09/01/2020: TC 91, HDL 37, TG 128, LDL 33  Medication Adjustments/Labs and Tests Ordered: Current medicines are reviewed at length with the patient today.  Concerns regarding medicines are outlined above.   Disposition: Follow-up with Dr. Mayford Knife or APP in 6 months  Signed, Rennis Harding, NP 10/19/2020 10:23 AM    [facility] at [facility] [address] [place], [place], [place] [address] Phone: [phone]; Fax: [phone]

## 2020-10-19 ENCOUNTER — Ambulatory Visit (INDEPENDENT_AMBULATORY_CARE_PROVIDER_SITE_OTHER): Payer: BC Managed Care – PPO | Admitting: Family Medicine

## 2020-10-19 ENCOUNTER — Encounter: Payer: Self-pay | Admitting: Family Medicine

## 2020-10-19 VITALS — BP 100/62 | HR 63 | Ht 73.0 in | Wt 191.0 lb

## 2020-10-19 DIAGNOSIS — Z720 Tobacco use: Secondary | ICD-10-CM

## 2020-10-19 DIAGNOSIS — I255 Ischemic cardiomyopathy: Secondary | ICD-10-CM

## 2020-10-19 DIAGNOSIS — I214 Non-ST elevation (NSTEMI) myocardial infarction: Secondary | ICD-10-CM

## 2020-10-19 DIAGNOSIS — E782 Mixed hyperlipidemia: Secondary | ICD-10-CM

## 2020-10-19 DIAGNOSIS — I2510 Atherosclerotic heart disease of native coronary artery without angina pectoris: Secondary | ICD-10-CM

## 2020-10-19 DIAGNOSIS — F1410 Cocaine abuse, uncomplicated: Secondary | ICD-10-CM

## 2020-10-19 NOTE — Patient Instructions (Signed)
Your physician wants you to follow-up in: 6 MONTHS WITH MD You will receive a reminder letter in the mail two months in advance. If you don't receive a letter, please call our office to schedule the follow-up appointment.  Your physician recommends that you continue on your current medications as directed. Please refer to the Current Medication list given to you today.  Thank you for choosing [facility]!!    

## 2020-11-22 ENCOUNTER — Encounter: Payer: Self-pay | Admitting: *Deleted

## 2020-11-22 ENCOUNTER — Other Ambulatory Visit: Payer: Self-pay | Admitting: *Deleted

## 2020-11-22 DIAGNOSIS — I2510 Atherosclerotic heart disease of native coronary artery without angina pectoris: Secondary | ICD-10-CM

## 2020-11-22 DIAGNOSIS — E782 Mixed hyperlipidemia: Secondary | ICD-10-CM

## 2020-12-09 LAB — HEPATIC FUNCTION PANEL
AG Ratio: 1.5 (calc) (ref 1.0–2.5)
ALT: 21 U/L (ref 9–46)
AST: 18 U/L (ref 10–35)
Albumin: 4.1 g/dL (ref 3.6–5.1)
Alkaline phosphatase (APISO): 89 U/L (ref 35–144)
Bilirubin, Direct: 0.2 mg/dL (ref 0.0–0.2)
Globulin: 2.7 g/dL (calc) (ref 1.9–3.7)
Indirect Bilirubin: 0.4 mg/dL (calc) (ref 0.2–1.2)
Total Bilirubin: 0.6 mg/dL (ref 0.2–1.2)
Total Protein: 6.8 g/dL (ref 6.1–8.1)

## 2020-12-09 LAB — LIPID PANEL
Cholesterol: 80 mg/dL (ref <200)
HDL: 42 mg/dL (ref >=40)
LDL Cholesterol (Calc): 24 mg/dL (calc)
Non-HDL Cholesterol (Calc): 38 mg/dL (calc) (ref <130)
Total CHOL/HDL Ratio: 1.9 (calc) (ref <5.0)
Triglycerides: 61 mg/dL (ref <150)

## 2020-12-14 ENCOUNTER — Encounter: Payer: Self-pay | Admitting: *Deleted

## 2020-12-20 ENCOUNTER — Telehealth: Payer: Self-pay | Admitting: Family Medicine

## 2020-12-20 NOTE — Telephone Encounter (Signed)
Please give pt a call- states that since heart attack he's had problems with intimacy and would like to speak with Mardelle Matte.   [phone]

## 2020-12-20 NOTE — Telephone Encounter (Signed)
Reports unable to keep an erection since heart attack. Reports being on medications since first week of September 2021 after heart attack. Does not have a PCP. Advised that message would be sent to Nena Polio, NP and he would be contacted with response tomorrow. Verbalized understanding.

## 2020-12-21 MED ORDER — SILDENAFIL CITRATE 50 MG PO TABS: 50.0000 mg | ORAL_TABLET | Freq: Every day | ORAL | 0 refills | Status: AC | PRN

## 2020-12-21 NOTE — Telephone Encounter (Signed)
Patient informed and verbalized understanding of plan. Gave number to [facility] and Dr. Patty Sermons office to arrange PCP care.

## 2020-12-21 NOTE — Telephone Encounter (Signed)
Tell him he can use Viagra but if he takes one, he cannot take any nitroglycerin like medication for 24 hours before or after taking the medication. If he starts having chest pain he cannot take those medications. It will make his BP drop significantly. This includes SL NTG, or isosorbide mononitrate or  dinitrate. Make sure he understands this. You can give him 5 doses of 50 mg pills of Viagra. He needs to establish with a PCP for further refills.

## 2021-03-13 ENCOUNTER — Telehealth: Payer: Self-pay | Admitting: Family Medicine

## 2021-03-13 MED ORDER — CARVEDILOL 3.125 MG PO TABS: 3.1250 mg | ORAL_TABLET | Freq: Two times a day (BID) | ORAL | 1 refills | Status: DC

## 2021-03-13 NOTE — Telephone Encounter (Signed)
*  STAT* If patient is at the pharmacy, call can be transferred to refill team.   1. Which medications need to be refilled? (please list name of each medication and dose if known)   carvedilol (COREG) 3.125 MG tablet [[id number]]   sildenafil (VIAGRA) 50 MG tablet [[id number]]    2. Which pharmacy/location (including street and city if local pharmacy) is medication to be sent to? [facility]    3. Do they need a 30 day or 90 day supply?  Did not say

## 2021-03-13 NOTE — Telephone Encounter (Signed)
Coreg sent to pharmacy - sildenafil was in February with notes by provider to establish with pcp to refill - pt needs f/u appt in June

## 2021-03-25 ENCOUNTER — Other Ambulatory Visit: Payer: Self-pay

## 2021-03-25 ENCOUNTER — Emergency Department (HOSPITAL_COMMUNITY): Payer: BC Managed Care – PPO

## 2021-03-25 ENCOUNTER — Emergency Department (HOSPITAL_COMMUNITY)
Admission: EM | Admit: 2021-03-25 | Discharge: 2021-03-25 | Disposition: A | Discharge status: Home / Self Care | Payer: BC Managed Care – PPO | Attending: Emergency Medicine | Admitting: Emergency Medicine

## 2021-03-25 ENCOUNTER — Encounter (HOSPITAL_COMMUNITY): Payer: Self-pay | Admitting: Emergency Medicine

## 2021-03-25 DIAGNOSIS — X500XXA Overexertion from strenuous movement or load, initial encounter: Secondary | ICD-10-CM | POA: Insufficient documentation

## 2021-03-25 DIAGNOSIS — Y990 Civilian activity done for income or pay: Secondary | ICD-10-CM | POA: Insufficient documentation

## 2021-03-25 DIAGNOSIS — I2510 Atherosclerotic heart disease of native coronary artery without angina pectoris: Secondary | ICD-10-CM | POA: Insufficient documentation

## 2021-03-25 DIAGNOSIS — Z7982 Long term (current) use of aspirin: Secondary | ICD-10-CM | POA: Diagnosis not present

## 2021-03-25 DIAGNOSIS — S59902A Unspecified injury of left elbow, initial encounter: Secondary | ICD-10-CM | POA: Insufficient documentation

## 2021-03-25 DIAGNOSIS — S59802A Other specified injuries of left elbow, initial encounter: Secondary | ICD-10-CM

## 2021-03-25 DIAGNOSIS — F17200 Nicotine dependence, unspecified, uncomplicated: Secondary | ICD-10-CM | POA: Insufficient documentation

## 2021-03-25 MED ORDER — PREDNISONE 10 MG PO TABS: ORAL_TABLET | ORAL | 0 refills | Status: DC

## 2021-03-25 MED ORDER — PREDNISONE 50 MG PO TABS
60.0000 mg | ORAL_TABLET | Freq: Once | ORAL | Status: AC
  Administered 2021-03-25: 60 mg via ORAL
  Filled 2021-03-25: qty 1

## 2021-03-25 NOTE — ED Triage Notes (Signed)
Pt reports hyperextending his left elbow. Pt has swelling to the elbow.

## 2021-03-25 NOTE — Discharge Instructions (Signed)
As discussed your x-rays tonight are negative for fracture or dislocation.  I suspect you may have a cartilage or soft tissue injury such as a tendon strain or sprain.  Rest the elbow is much as possible, ice and elevation along with using the sling will be helpful.  Take the prednisone taper to help with inflammation.  Plan to call Dr. Jena Gauss for further evaluation of his injury.

## 2021-03-26 NOTE — ED Provider Notes (Signed)
[facility] EMERGENCY DEPARTMENT Provider Note   CSN: [id number] Arrival date & time: 03/25/21  2027     History Chief Complaint  Patient presents with  . Arm Pain    Billy Briggs is a 55 y.o. male.  The history is provided by the patient.  Arm Pain This is a new problem. The current episode started 3 to 5 hours ago. The problem occurs constantly. The problem has not changed since onset.The symptoms are aggravated by bending and twisting (palpation and extension at the elbow worsens pain.). The symptoms are relieved by ice. He has tried rest and a cold compress for the symptoms.   Pt sustained a hyperextension injury around 5 pm today.  His large puppy attempted to jump out of his vehicle (stopped) and hit his left forearm causing hyperextension. Left handed.  Has developed swelling over the lateral epicondyle, early bruising.  Sensation of near locking as he moves towards near extension.      Past Medical History:  Diagnosis Date  . CAD (coronary artery disease)   . Cocaine use   . Ischemic cardiomyopathy   . NSTEMI (non-ST elevated myocardial infarction) (HCC)   . Tobacco use     Patient Active Problem List   Diagnosis Date Noted  . Tobacco abuse 07/12/2020  . CAD in native artery 07/12/2020  . AKI (acute kidney injury) (HCC) 07/12/2020  . Cocaine abuse (HCC)   . Ischemic cardiomyopathy   . NSTEMI (non-ST elevated myocardial infarction) (HCC) 07/09/2020    Past Surgical History:  Procedure Laterality Date  . CORONARY ANGIOGRAPHY N/A 07/11/2020   Procedure: CORONARY ANGIOGRAPHY;  Surgeon: Lennette Bihari, MD;  Location: [facility] INVASIVE CV LAB;  Service: Cardiovascular;  Laterality: N/A;  . CORONARY STENT INTERVENTION N/A 07/11/2020   Procedure: CORONARY STENT INTERVENTION;  Surgeon: Lennette Bihari, MD;  Location: [facility] INVASIVE CV LAB;  Service: Cardiovascular;  Laterality: N/A;  . INTRAVASCULAR ULTRASOUND/IVUS N/A 07/11/2020   Procedure: Intravascular Ultrasound/IVUS;   Surgeon: Lennette Bihari, MD;  Location: [facility] INVASIVE CV LAB;  Service: Cardiovascular;  Laterality: N/A;  . LEFT HEART CATH AND CORONARY ANGIOGRAPHY N/A 07/09/2020   Procedure: LEFT HEART CATH AND CORONARY ANGIOGRAPHY;  Surgeon: Lennette Bihari, MD;  Location: [facility] INVASIVE CV LAB;  Service: Cardiovascular;  Laterality: N/A;       Family History  Problem Relation Age of Onset  . Cancer Mother     Social History   Tobacco Use  . Smoking status: Current Some Day Smoker  . Smokeless tobacco: Never Used  Substance Use Topics  . Alcohol use: Yes    Comment: occ  . Drug use: Yes    Comment: crack occ    Home Medications Prior to Admission medications   Medication Sig Start Date End Date Taking? Authorizing Provider  predniSONE (DELTASONE) 10 MG tablet Take 6 tablets day one, 5 tablets day two, 4 tablets day three, 3 tablets day four, 2 tablets day five, then 1 tablet day six 03/25/21  Yes Xzavion Doswell, Fidela Juneau  aspirin EC 81 MG EC tablet Take 1 tablet (81 mg total) by mouth daily. Swallow whole. 07/13/20   Dunn, Tacey Ruiz, PA-C  atorvastatin (LIPITOR) 80 MG tablet Take 0.5 tablets (40 mg total) by mouth every evening. 09/11/20   Netta Neat., NP  carvedilol (COREG) 3.125 MG tablet Take 1 tablet (3.125 mg total) by mouth 2 (two) times daily with a meal. 03/13/21   Netta Neat., NP  diphenhydramine-acetaminophen (TYLENOL  PM) 25-500 MG TABS tablet Take 1 tablet by mouth at bedtime as needed.    [provider]  nitroGLYCERIN (NITROSTAT) 0.4 MG SL tablet Place 1 tablet (0.4 mg total) under the tongue every 5 (five) minutes as needed for chest pain (up to 3 doses). 07/12/20   Dunn, Tacey Ruiz, PA-C  sildenafil (VIAGRA) 50 MG tablet Take 1 tablet (50 mg total) by mouth daily as needed for erectile dysfunction. 12/21/20   Netta Neat., NP  ticagrelor (BRILINTA) 90 MG TABS tablet Take 1 tablet (90 mg total) by mouth 2 (two) times daily. 07/12/20   Laurann Montana, PA-C    Allergies     Patient has no known allergies.  Review of Systems   Review of Systems  Constitutional: Negative for fever.  Musculoskeletal: Positive for arthralgias and joint swelling. Negative for myalgias.  Neurological: Negative for weakness and numbness.  All other systems reviewed and are negative.   Physical Exam Updated Vital Signs BP 110/78   Pulse 80   Temp (!) 97.4 F (36.3 C) (Oral)   Resp 18   Wt 86.2 kg   SpO2 99%   BMI 25.07 kg/m   Physical Exam Constitutional:      Appearance: He is well-developed.  HENT:     Head: Atraumatic.  Cardiovascular:     Rate and Rhythm: Normal rate.     Comments: Pulses equal bilaterally Pulmonary:     Effort: Pulmonary effort is normal.  Musculoskeletal:        General: Swelling and tenderness present.     Left elbow: Swelling present. No deformity or effusion. Decreased range of motion. Tenderness present in lateral epicondyle.     Left forearm: Normal.     Left wrist: Normal.     Left hand: Normal.     Comments: No crepitus, popping or locking with FROM, but not tolerated well due to pain.  Moderate edema with early bruising over the lateral epicondyle. Elbow pain is worsened with wrist flex/ext  Skin:    General: Skin is warm and dry.  Neurological:     Mental Status: He is alert.     Sensory: No sensory deficit.     Deep Tendon Reflexes: Reflexes normal.     ED Results / Procedures / Treatments   Labs (all labs ordered are listed, but only abnormal results are displayed) Labs Reviewed - No data to display  EKG None  Radiology DG Elbow Complete Left  Result Date: 03/25/2021 CLINICAL DATA:  Left elbow pain EXAM: LEFT ELBOW - COMPLETE 3+ VIEW COMPARISON:  None. FINDINGS: There is no evidence of fracture, dislocation, or joint effusion. There is no evidence of arthropathy or other focal bone abnormality. Soft tissues are unremarkable. IMPRESSION: Negative. Electronically Signed   By: Helyn Numbers MD   On: 03/25/2021 22:01     Procedures Procedures   Medications Ordered in ED Medications  predniSONE (DELTASONE) tablet 60 mg (60 mg Oral Given 03/25/21 2303)    ED Course  I have reviewed the triage vital signs and the nursing notes.  Pertinent labs & imaging results that were available during my care of the patient were reviewed by me and considered in my medical decision making (see chart for details).    MDM Rules/Calculators/A&P                          Imaging reviewed, negative. Elbow sprain vs ligament tear/cartilage injury.  Pt  was placed in a sling for comfort, discussed RICE tx.  Started prednisone taper.  F/u ortho, referrals given.  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 Final Clinical Impression(s) / ED Diagnoses Final diagnoses:  Elbow hyperextension injury, left, initial encounter    Rx / DC Orders ED Discharge Orders         Ordered    predniSONE (DELTASONE) 10 MG tablet        03/25/21 2252           Burgess Amor, PA-C 03/26/21 1625    Bethann Berkshire, MD 03/27/21 [phone]

## 2021-03-28 ENCOUNTER — Encounter: Payer: Self-pay | Admitting: Orthopedic Surgery

## 2021-03-28 ENCOUNTER — Ambulatory Visit: Payer: BC Managed Care – PPO | Admitting: Orthopedic Surgery

## 2021-03-28 ENCOUNTER — Other Ambulatory Visit: Payer: Self-pay

## 2021-03-28 VITALS — BP 136/95 | HR 68 | Ht 73.0 in | Wt 189.0 lb

## 2021-03-28 DIAGNOSIS — S59802A Other specified injuries of left elbow, initial encounter: Secondary | ICD-10-CM | POA: Diagnosis not present

## 2021-03-28 NOTE — Patient Instructions (Signed)
OOW 2 WEEK /MAY GO TO 6   RANGE OF MOTION BEND THE ELBOW AND STRAIGHTEN IT

## 2021-03-28 NOTE — Progress Notes (Signed)
Chief Complaint  Patient presents with  . Elbow Pain    Left elbow pain, patient reports that he was at the dog park and his 46 month old [place] husky pulled his arm and hyprextended the elbow.    55 year old male hyperextended his left elbow when his dog jumped out of a car caught his arm against the door jam.  He did go to work that night but was unable to perform his activities and his supervisor sent him to the ER x-rays were negative he complains of medial lateral pain medial and lateral swelling  No numbness or tingling no skin breaks.  Past Medical History:  Diagnosis Date  . CAD (coronary artery disease)   . Cocaine use   . Ischemic cardiomyopathy   . NSTEMI (non-ST elevated myocardial infarction) (HCC)   . Tobacco use     BP (!) 136/95   Pulse 68   Ht 6\' 1"  (1.854 m)   Wt 189 lb (85.7 kg)   BMI 24.94 kg/m   He is awake and alert he is oriented x3 his mood is normal his body habitus is muscular  He has swelling and tenderness on the medial and lateral epicondyles with some bruising he is stable in extension and flexion to varus valgus stress testing he has some limitations in flexion of about 20degrees pain with extension no instability  I reviewed the x-ray he has no fracture or dislocation  Hyperextension left elbow  Anti-inflammatories and active range of motion  Out of work 2 weeks and then follow-up may have to extend to 6 weeks  He should avoid heavy lifting  Encounter Diagnosis  Name Primary?  . Hyperextension injury of left elbow, initial encounter Yes

## 2021-03-30 ENCOUNTER — Telehealth: Payer: Self-pay | Admitting: Orthopedic Surgery

## 2021-03-30 NOTE — Telephone Encounter (Signed)
Patient came to office with an additional form requesting to have filled out, 1-page form from accident/sickness policy. Due to other form fees, patient signed an authorization form to release to "[employer]" - fax [phone]. Aware we will take care in-house, however, if this requester sends additional requests, patient will need to proceed as with his other forms, through [employer], our forms provider. Understands.

## 2021-04-03 ENCOUNTER — Telehealth: Payer: Self-pay | Admitting: Orthopedic Surgery

## 2021-04-03 NOTE — Telephone Encounter (Signed)
Patient called to check the status of his paperwork that was filled out on 03/30/21. Please call back.

## 2021-04-03 NOTE — Telephone Encounter (Signed)
Called patient; relayed okay for pick up tomorrow.

## 2021-04-03 NOTE — Telephone Encounter (Signed)
Opened in error

## 2021-04-12 ENCOUNTER — Other Ambulatory Visit: Payer: Self-pay

## 2021-04-12 ENCOUNTER — Encounter: Payer: Self-pay | Admitting: Orthopedic Surgery

## 2021-04-12 ENCOUNTER — Ambulatory Visit (INDEPENDENT_AMBULATORY_CARE_PROVIDER_SITE_OTHER): Payer: BC Managed Care – PPO | Admitting: Orthopedic Surgery

## 2021-04-12 VITALS — BP 117/80 | HR 91 | Ht 73.0 in | Wt 190.0 lb

## 2021-04-12 DIAGNOSIS — S59802D Other specified injuries of left elbow, subsequent encounter: Secondary | ICD-10-CM | POA: Diagnosis not present

## 2021-04-12 NOTE — Progress Notes (Signed)
Chief Complaint  Patient presents with   Elbow Injury    Left/ follow up    55 year old status post hyperextension left elbow improving daily  He still has some soreness and tightness and weakness on extension so working to give him about 2 more weeks to try to get a little stronger and then he will be able to return to work  Encounter Diagnosis  Name Primary?   Hyperextension injury of left elbow, subsequent encounter Yes

## 2021-04-12 NOTE — Patient Instructions (Signed)
Call us when ready to return to work   OOW 2 weeks

## 2021-04-26 ENCOUNTER — Ambulatory Visit: Payer: BC Managed Care – PPO | Admitting: Orthopedic Surgery

## 2021-04-26 ENCOUNTER — Other Ambulatory Visit: Payer: Self-pay

## 2021-04-26 ENCOUNTER — Encounter: Payer: Self-pay | Admitting: Orthopedic Surgery

## 2021-04-26 VITALS — Ht 73.0 in | Wt 190.0 lb

## 2021-04-26 DIAGNOSIS — S59802D Other specified injuries of left elbow, subsequent encounter: Secondary | ICD-10-CM | POA: Diagnosis not present

## 2021-04-26 NOTE — Patient Instructions (Signed)
Return to work note

## 2021-04-26 NOTE — Progress Notes (Signed)
FOLLOW UP   Encounter Diagnosis  Name Primary?   Hyperextension injury of left elbow, subsequent encounter Yes     Chief Complaint  Patient presents with   Elbow Injury    Left/ 03/25/21       TREATMENT BRIEF PERIOD OF IMMOBILIZATION AND EARLY AROM F/B PROGRESSIVE STRENGTHENING   Hes doing fine now   Normal rom and strength   Encounter Diagnosis  Name Primary?   Hyperextension injury of left elbow, subsequent encounter Yes    Ret to work   Released

## 2021-05-26 ENCOUNTER — Other Ambulatory Visit: Payer: Self-pay | Admitting: Family Medicine

## 2021-08-14 ENCOUNTER — Ambulatory Visit (INDEPENDENT_AMBULATORY_CARE_PROVIDER_SITE_OTHER): Payer: BC Managed Care – PPO | Admitting: Cardiology

## 2021-08-14 ENCOUNTER — Encounter: Payer: Self-pay | Admitting: Cardiology

## 2021-08-14 VITALS — BP 108/66 | HR 71 | Ht 73.0 in | Wt 184.0 lb

## 2021-08-14 DIAGNOSIS — I25119 Atherosclerotic heart disease of native coronary artery with unspecified angina pectoris: Secondary | ICD-10-CM | POA: Diagnosis not present

## 2021-08-14 DIAGNOSIS — Z8679 Personal history of other diseases of the circulatory system: Secondary | ICD-10-CM

## 2021-08-14 NOTE — Progress Notes (Signed)
Cardiology Office Note  Date: 08/14/2021   ID: Billy Briggs, DOB 06/02/1966, MRN [id number]  PCP:  [facility]  Cardiologist:  Nona Dell, MD Electrophysiologist:  None   Chief Complaint  Patient presents with   Cardiac follow-up     History of Present Illness: Billy Briggs is a 55 y.o. male presenting for a follow-up visit, this is our first meeting today.  I reviewed extensive records and updated the chart.  He was last seen in the office by Mr. Vincenza Hews NP in December 2021.  He is here for a follow-up visit.  Reports no chest pain or nitroglycerin use.  Continues to work at [employer].  He states that he has been taking his medications regularly.  I personally reviewed his ECG today which shows sinus rhythm with leftward axis.  Follow-up echocardiogram in December 2021 revealed normalization of LVEF in the range of 55 to 60%, no regional wall motion abnormalities, mild mitral regurgitation.  We went over his medications and discussed stopping Brilinta, he is now 1 year out from DES intervention to the LAD.  He is following at [facility] for primary care.  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, UDS positive for cocaine    Past Surgical History:  Procedure Laterality Date   CORONARY ANGIOGRAPHY N/A 07/11/2020   Procedure: CORONARY ANGIOGRAPHY;  Surgeon: Lennette Bihari, MD;  Location: [facility] INVASIVE CV LAB;  Service: Cardiovascular;  Laterality: N/A;   CORONARY STENT INTERVENTION N/A 07/11/2020   Procedure: CORONARY STENT INTERVENTION;  Surgeon: Lennette Bihari, MD;  Location: [facility] INVASIVE CV LAB;  Service: Cardiovascular;  Laterality: N/A;   INTRAVASCULAR ULTRASOUND/IVUS N/A 07/11/2020    Procedure: Intravascular Ultrasound/IVUS;  Surgeon: Lennette Bihari, MD;  Location: [facility] INVASIVE CV LAB;  Service: Cardiovascular;  Laterality: N/A;   LEFT HEART CATH AND CORONARY ANGIOGRAPHY N/A 07/09/2020   Procedure: LEFT HEART CATH AND CORONARY ANGIOGRAPHY;  Surgeon: Lennette Bihari, MD;  Location: [facility] INVASIVE CV LAB;  Service: Cardiovascular;  Laterality: N/A;    Current Outpatient Medications  Medication Sig Dispense Refill   aspirin EC 81 MG EC tablet Take 1 tablet (81 mg total) by mouth daily. Swallow whole. 30 tablet 11   atorvastatin (LIPITOR) 80 MG tablet Take 0.5 tablets (40 mg total) by mouth every evening.     carvedilol (COREG) 3.125 MG tablet TAKE 1 TABLET BY MOUTH TWICE DAILY WITH A MEAL 60 tablet 6   nitroGLYCERIN (NITROSTAT) 0.4 MG SL tablet Place 1 tablet (0.4 mg total) under the tongue every 5 (five) minutes as needed for chest pain (up to 3 doses). 25 tablet 3   sildenafil (VIAGRA) 50 MG tablet Take 1 tablet (50 mg total) by mouth daily as needed for erectile dysfunction. 5 tablet 0   traZODone (DESYREL) 50 MG tablet Take 25-50 mg by mouth at bedtime.     diphenhydramine-acetaminophen (TYLENOL PM) 25-500 MG TABS tablet Take 1 tablet by mouth at bedtime as needed. (Patient not taking: Reported on 08/14/2021)     No current facility-administered medications for this visit.   Allergies:  Patient has no known allergies.   ROS: No palpitations or syncope.  Physical Exam: VS:  BP 108/66  Pulse 71   Ht 6\' 1"  (1.854 m)   Wt 184 lb (83.5 kg)   SpO2 97%   BMI 24.28 kg/m , BMI Body mass index is 24.28 kg/m.  Wt Readings from Last 3 Encounters:  08/14/21 184 lb (83.5 kg)  04/26/21 190 lb (86.2 kg)  04/12/21 190 lb (86.2 kg)    General: Patient appears comfortable at rest. HEENT: Conjunctiva and lids normal, wearing a mask. Neck: Supple, no elevated JVP or carotid bruits, no thyromegaly. Lungs: Clear to auscultation, nonlabored breathing at rest. Cardiac: Regular rate  and rhythm, no S3 or significant systolic murmur, no pericardial rub. Extremities: No pitting edema.  ECG:  An ECG dated 07/08/2020 was personally reviewed today and demonstrated:  Sinus rhythm with old anterior infarct pattern.  Recent Labwork: 12/08/2020: ALT 21; AST 18     Component Value Date/Time   CHOL 80 12/08/2020 1158   TRIG 61 12/08/2020 1158   HDL 42 12/08/2020 1158   CHOLHDL 1.9 12/08/2020 1158   VLDL 20 07/09/2020 0751   LDLCALC 24 12/08/2020 1158    Other Studies Reviewed Today:  Echocardiogram 10/12/2020:  1. Left ventricular ejection fraction, by estimation, is 55 to 60%. The  left ventricle has normal function. The left ventricle has no regional  wall motion abnormalities. Left ventricular diastolic parameters were  normal. Normal global longitudinal  strain of -19.5%.   2. Right ventricular systolic function is normal. The right ventricular  size is normal. There is mildly elevated pulmonary artery systolic  pressure. The estimated right ventricular systolic pressure is 36.5 mmHg.   3. The mitral valve is grossly normal. Mild mitral valve regurgitation.   4. The aortic valve is tricuspid. Aortic valve regurgitation is not  visualized.   5. The inferior vena cava is normal in size with <50% respiratory  variability, suggesting right atrial pressure of 8 mmHg.   Assessment and Plan:  1.  CAD status post DES to the ostial/proximal LAD in September 2021 in the setting of NSTEMI as discussed above.  Plan to discontinue Brilinta as he is over one year out from intervention.  He reports no active angina and ECG is stable.  Otherwise continue aspirin, Coreg, Lipitor, and as needed nitroglycerin.  Last LDL was 24.  2.  History of cardiomyopathy with normalization of LVEF, 55 to 60% by echocardiogram in December 2021.  Medication Adjustments/Labs and Tests Ordered: Current medicines are reviewed at length with the patient today.  Concerns regarding medicines are outlined  above.   Tests Ordered: Orders Placed This Encounter  Procedures   EKG 12-Lead     Medication Changes: No orders of the defined types were placed in this encounter.   Disposition:  Follow up  6 months.  Signed, January 2022, MD, [facility] 08/14/2021 11:32 AM    [facility] [address] [place], [place], [place] Phone: [phone]; Fax: [phone]

## 2021-08-14 NOTE — Patient Instructions (Addendum)
Medication Instructions:  Your physician has recommended you make the following change in your medication:  Stop brilinta Continue other medications the same  Labwork: none  Testing/Procedures: none  Follow-Up: Your physician recommends that you schedule a follow-up appointment in:6 months   Any Other Special Instructions Will Be Listed Below (If Applicable).  If you need a refill on your cardiac medications before your next appointment, please call your pharmacy. 

## 2021-09-20 ENCOUNTER — Other Ambulatory Visit: Payer: Self-pay | Admitting: *Deleted

## 2021-09-20 MED ORDER — ATORVASTATIN CALCIUM 80 MG PO TABS
40.0000 mg | ORAL_TABLET | Freq: Every evening | ORAL | 3 refills | Status: DC
Start: 2021-09-20 — End: 2022-10-21

## 2021-11-19 ENCOUNTER — Telehealth: Payer: Self-pay

## 2021-11-19 MED ORDER — CARVEDILOL 3.125 MG PO TABS: 3.1250 mg | ORAL_TABLET | Freq: Two times a day (BID) | ORAL | 3 refills | Status: DC

## 2021-11-19 NOTE — Telephone Encounter (Signed)
Medication refill request approved for Coreg 3.125 mg tablets and sent to [facility] per pt's request.

## 2021-12-28 IMAGING — DX DG ELBOW COMPLETE 3+V*L*
4 series · 4 of 4 positions shown · non-contrast
Comparison: None.

CLINICAL DATA: Left elbow pain

EXAM:
LEFT ELBOW - COMPLETE 3+ VIEW

[elbow ap]
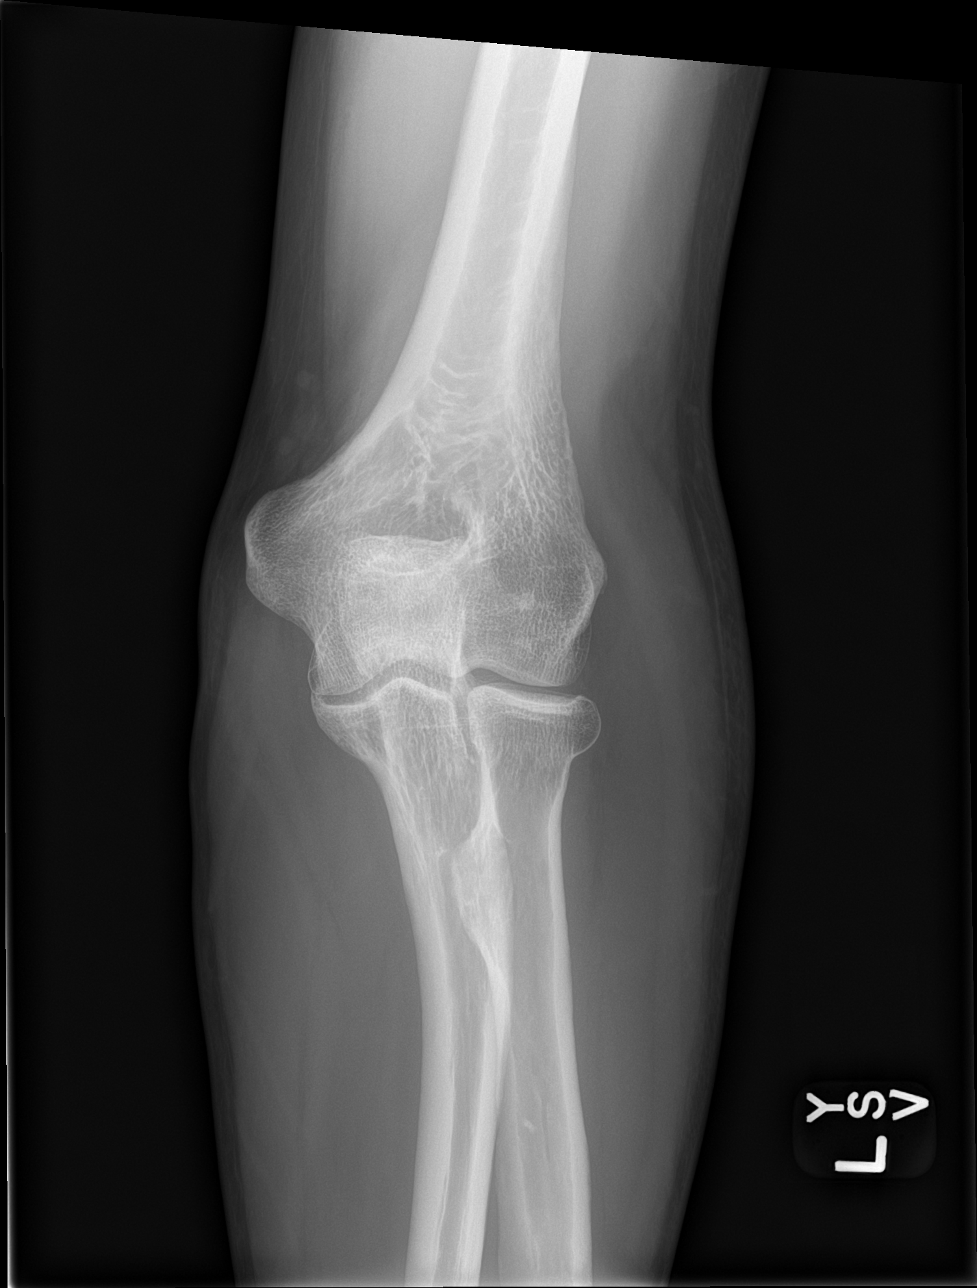

[elbow obl (1 of 2)]
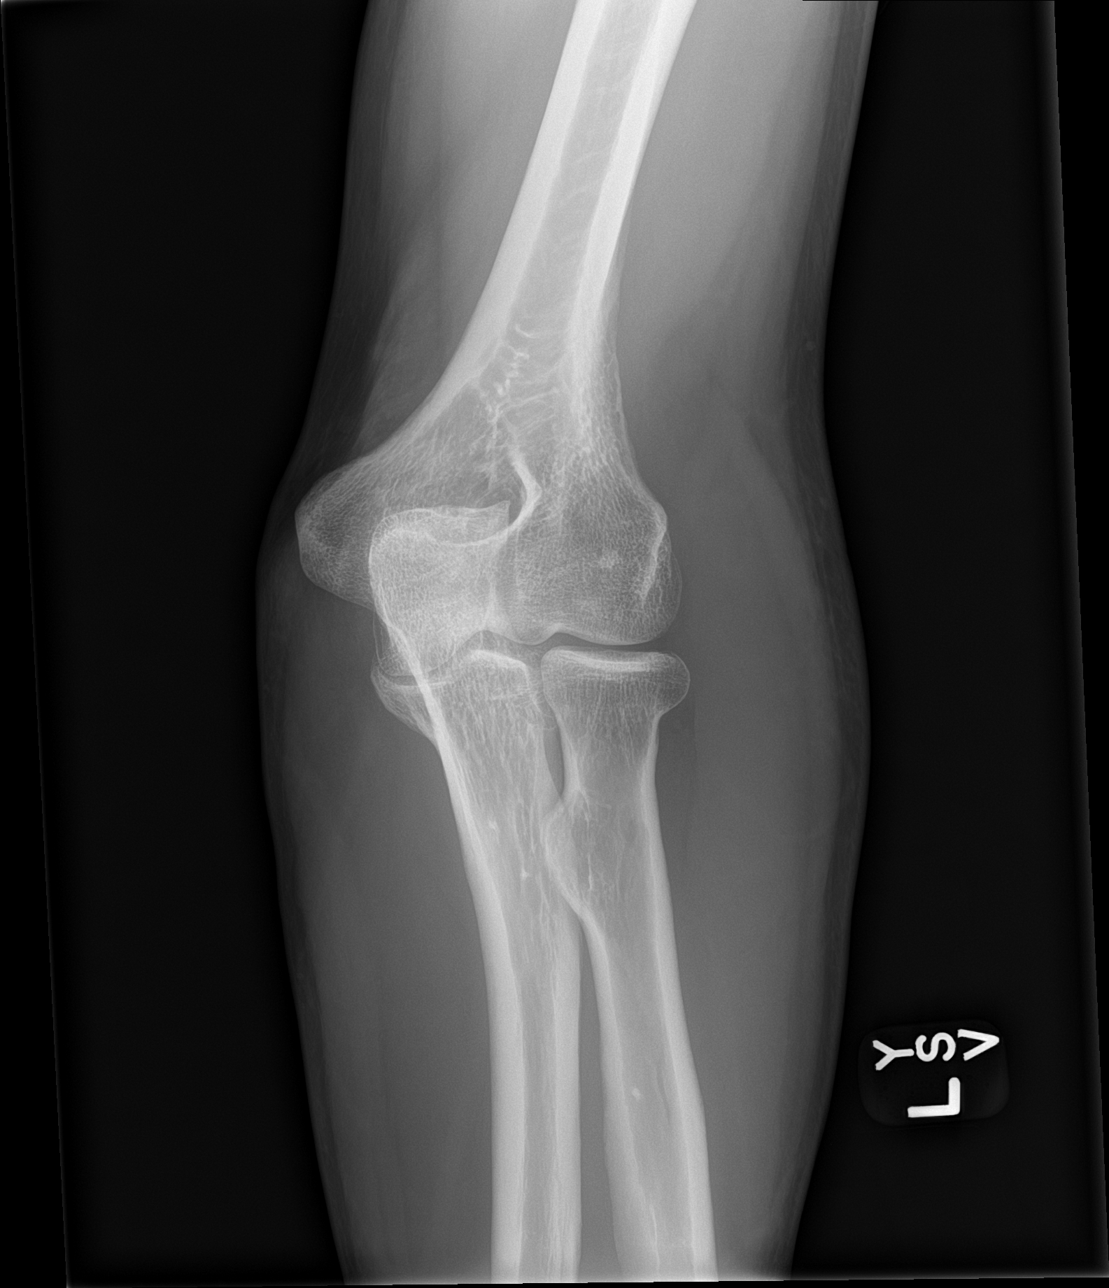

[elbow lat]
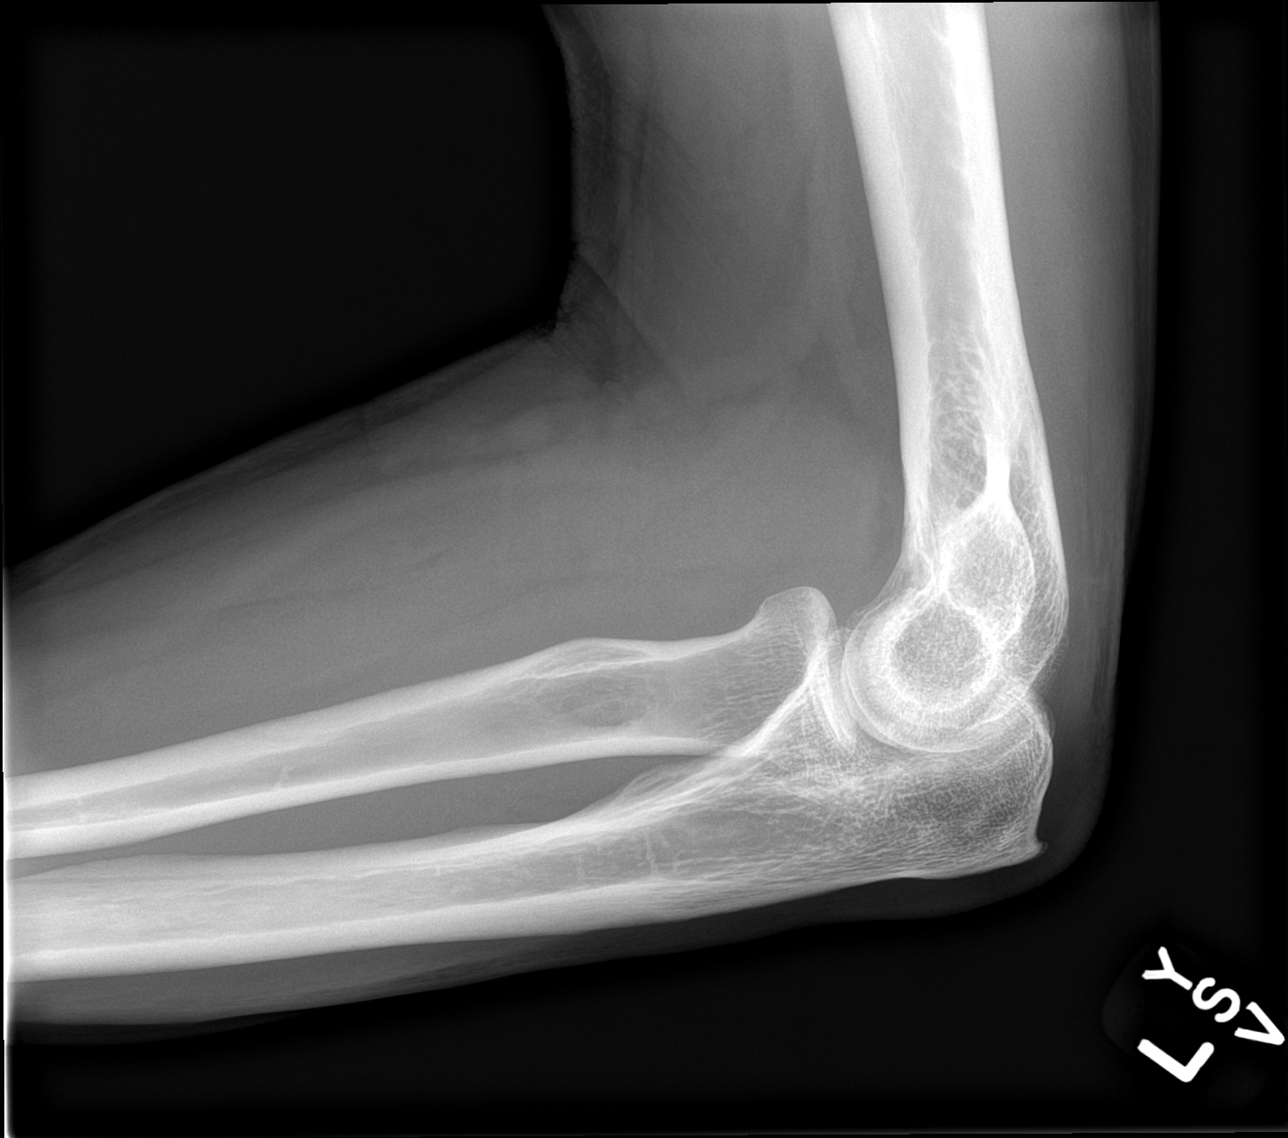

[elbow obl (2 of 2)]
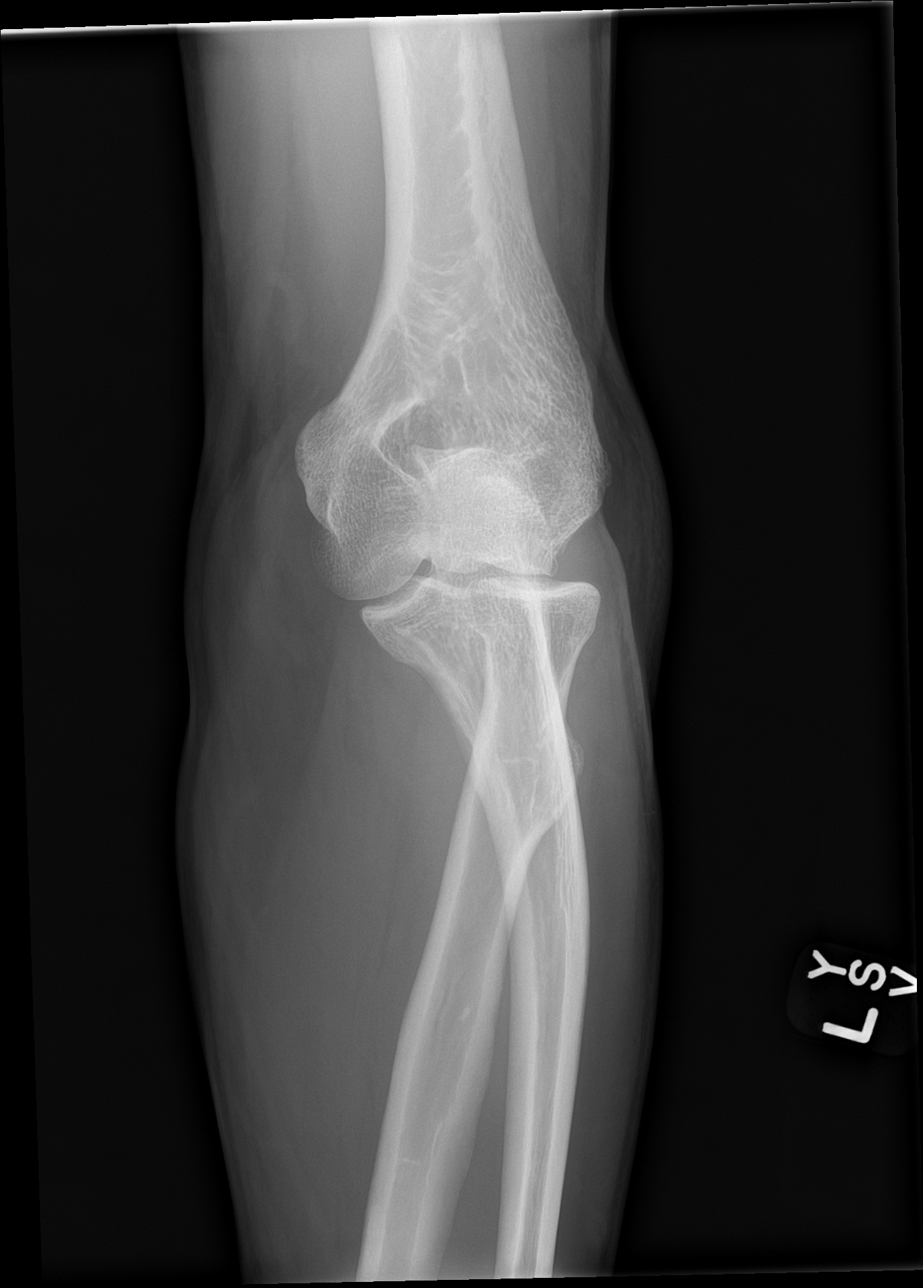

[4 of 4 positions shown; findings below may reference images not displayed]

FINDINGS: There is no evidence of fracture, dislocation, or joint effusion.
There is no evidence of arthropathy or other focal bone abnormality.
Soft tissues are unremarkable.
IMPRESSION: Negative.

## 2022-01-29 ENCOUNTER — Telehealth: Payer: Self-pay | Admitting: Cardiology

## 2022-01-29 MED ORDER — NITROGLYCERIN 0.4 MG SL SUBL: 0.4000 mg | SUBLINGUAL_TABLET | SUBLINGUAL | 3 refills | Status: AC | PRN

## 2022-01-29 NOTE — Telephone Encounter (Signed)
Refill sent to [facility] in [place], per patient request. ?

## 2022-01-29 NOTE — Telephone Encounter (Signed)
?*  STAT* If patient is at the pharmacy, call can be transferred to refill team. ? ? ?1. Which medications need to be refilled? (please list name of each medication and dose if known)  ?nitroGLYCERIN (NITROSTAT) 0.4 MG SL tablet ? ?2. Which pharmacy/location (including street and city if local pharmacy) is medication to be sent to? [place], [place] [id number] ? ?3. Do they need a 30 day or 90 day supply? 90 day ? ? ?Patient is completely out of medication. ?

## 2022-06-06 ENCOUNTER — Emergency Department (HOSPITAL_COMMUNITY): Payer: BC Managed Care – PPO

## 2022-06-06 ENCOUNTER — Emergency Department (HOSPITAL_COMMUNITY)
Admission: EM | Admit: 2022-06-06 | Discharge: 2022-06-06 | Disposition: A | Discharge status: Home / Self Care | Payer: BC Managed Care – PPO | Attending: Emergency Medicine | Admitting: Emergency Medicine

## 2022-06-06 ENCOUNTER — Encounter (HOSPITAL_COMMUNITY): Payer: Self-pay

## 2022-06-06 ENCOUNTER — Other Ambulatory Visit: Payer: Self-pay

## 2022-06-06 DIAGNOSIS — F17200 Nicotine dependence, unspecified, uncomplicated: Secondary | ICD-10-CM | POA: Insufficient documentation

## 2022-06-06 DIAGNOSIS — I2510 Atherosclerotic heart disease of native coronary artery without angina pectoris: Secondary | ICD-10-CM | POA: Insufficient documentation

## 2022-06-06 DIAGNOSIS — Z7982 Long term (current) use of aspirin: Secondary | ICD-10-CM | POA: Insufficient documentation

## 2022-06-06 DIAGNOSIS — R072 Precordial pain: Secondary | ICD-10-CM | POA: Insufficient documentation

## 2022-06-06 LAB — CBC WITH DIFFERENTIAL/PLATELET
Abs Immature Granulocytes: 0.02 10*3/uL (ref 0.00–0.07)
Basophils Absolute: 0 10*3/uL (ref 0.0–0.1)
Basophils Relative: 1 %
Eosinophils Absolute: 0.2 10*3/uL (ref 0.0–0.5)
Eosinophils Relative: 4 %
HCT: 39.1 % (ref 39.0–52.0)
Hemoglobin: 13.1 g/dL (ref 13.0–17.0)
Immature Granulocytes: 0 %
Lymphocytes Relative: 34 %
Lymphs Abs: 1.6 10*3/uL (ref 0.7–4.0)
MCH: 28.5 pg (ref 26.0–34.0)
MCHC: 33.5 g/dL (ref 30.0–36.0)
MCV: 85.2 fL (ref 80.0–100.0)
Monocytes Absolute: 0.5 10*3/uL (ref 0.1–1.0)
Monocytes Relative: 11 %
Neutro Abs: 2.3 10*3/uL (ref 1.7–7.7)
Neutrophils Relative %: 50 %
Platelets: 190 10*3/uL (ref 150–400)
RBC: 4.59 MIL/uL (ref 4.22–5.81)
RDW: 14.2 % (ref 11.5–15.5)
WBC: 4.6 10*3/uL (ref 4.0–10.5)
nRBC: 0 % (ref 0.0–0.2)

## 2022-06-06 LAB — TROPONIN I (HIGH SENSITIVITY)
Troponin I (High Sensitivity): <2 ng/L (ref <18)
Troponin I (High Sensitivity): <2 ng/L (ref <18)

## 2022-06-06 LAB — BASIC METABOLIC PANEL
Anion gap: 4 — ABNORMAL LOW (ref 5–15)
BUN: 15 mg/dL (ref 6–20)
CO2: 21 mmol/L — ABNORMAL LOW (ref 22–32)
Calcium: 7.9 mg/dL — ABNORMAL LOW (ref 8.9–10.3)
Chloride: 114 mmol/L — ABNORMAL HIGH (ref 98–111)
Creatinine, Ser: 0.98 mg/dL (ref 0.61–1.24)
GFR, Estimated: >60 mL/min (ref >60)
Glucose, Bld: 149 mg/dL — ABNORMAL HIGH (ref 70–99)
Potassium: 3.5 mmol/L (ref 3.5–5.1)
Sodium: 139 mmol/L (ref 135–145)

## 2022-06-06 NOTE — Discharge Instructions (Signed)
Return for any new or worse chest pain any chest pain lasting 15 minutes or longer.  Today's work-up without any acute findings.  Make an appointment to follow-up with your cardiologist.

## 2022-06-06 NOTE — ED Provider Notes (Addendum)
[facility] EMERGENCY DEPARTMENT Provider Note   CSN: [id number] Arrival date & time: 06/06/22  1937     History  Chief Complaint  Patient presents with   Chest Pain    Billy Briggs is a 56 y.o. male.  Patient with acute onset of left anterior chest pain at 1800.  Now resolved.  Did not radiate.  Not associated with nausea or vomiting no shortness of breath.  Patient is not sure if it just was anxiety.  But he does have a history of coronary artery disease and has an LAD stent.  Followed by [facility] cardiology in the [place] area.  Past medical history significant for coronary artery disease had a non-STEMI in 2021 history of cocaine use.  Patient smokes some days.       Home Medications Prior to Admission medications   Medication Sig Start Date End Date Taking? Authorizing Provider  aspirin EC 81 MG EC tablet Take 1 tablet (81 mg total) by mouth daily. Swallow whole. 07/13/20   Dunn, Tacey Ruiz, PA-C  atorvastatin (LIPITOR) 80 MG tablet Take 0.5 tablets (40 mg total) by mouth every evening. 09/20/21   Jonelle Sidle, MD  carvedilol (COREG) 3.125 MG tablet Take 1 tablet (3.125 mg total) by mouth 2 (two) times daily with a meal. 11/19/21   Jonelle Sidle, MD  diphenhydramine-acetaminophen (TYLENOL PM) 25-500 MG TABS tablet Take 1 tablet by mouth at bedtime as needed. Patient not taking: Reported on 08/14/2021    [provider]  nitroGLYCERIN (NITROSTAT) 0.4 MG SL tablet Place 1 tablet (0.4 mg total) under the tongue every 5 (five) minutes x 3 doses as needed for chest pain (up to 3 doses). 01/29/22   Jonelle Sidle, MD  sildenafil (VIAGRA) 50 MG tablet Take 1 tablet (50 mg total) by mouth daily as needed for erectile dysfunction. 12/21/20   Netta Neat., NP  traZODone (DESYREL) 50 MG tablet Take 25-50 mg by mouth at bedtime. 07/24/21   [provider]      Allergies    Patient has no known allergies.    Review of Systems   Review of Systems   Constitutional:  Negative for chills and fever.  HENT:  Negative for ear pain and sore throat.   Eyes:  Negative for pain and visual disturbance.  Respiratory:  Negative for cough and shortness of breath.   Cardiovascular:  Positive for chest pain. Negative for palpitations.  Gastrointestinal:  Negative for abdominal pain and vomiting.  Genitourinary:  Negative for dysuria and hematuria.  Musculoskeletal:  Negative for arthralgias and back pain.  Skin:  Negative for color change and rash.  Neurological:  Negative for seizures and syncope.  All other systems reviewed and are negative.   Physical Exam Updated Vital Signs BP 109/71   Pulse 73   Temp 97.6 F (36.4 C) (Oral)   Resp (!) 21   Ht 1.854 m (6\' 1" )   Wt 86.2 kg   SpO2 95%   BMI 25.07 kg/m  Physical Exam Vitals and nursing note reviewed.  Constitutional:      General: He is not in acute distress.    Appearance: He is well-developed. He is not ill-appearing.  HENT:     Head: Normocephalic and atraumatic.  Eyes:     Conjunctiva/sclera: Conjunctivae normal.  Cardiovascular:     Rate and Rhythm: Normal rate and regular rhythm.     Heart sounds: No murmur heard. Pulmonary:     Effort: Pulmonary  effort is normal. No respiratory distress.     Breath sounds: Normal breath sounds. No decreased breath sounds, wheezing, rhonchi or rales.  Abdominal:     Palpations: Abdomen is soft.     Tenderness: There is no abdominal tenderness.  Musculoskeletal:        General: No swelling.     Cervical back: Neck supple.     Right lower leg: No edema.     Left lower leg: No edema.  Skin:    General: Skin is warm and dry.     Capillary Refill: Capillary refill takes less than 2 seconds.  Neurological:     General: No focal deficit present.     Mental Status: He is alert and oriented to person, place, and time.  Psychiatric:        Mood and Affect: Mood normal.     ED Results / Procedures / Treatments   Labs (all labs  ordered are listed, but only abnormal results are displayed) Labs Reviewed  BASIC METABOLIC PANEL - Abnormal; Notable for the following components:      Result Value   Chloride 114 (*)    CO2 21 (*)    Glucose, Bld 149 (*)    Calcium 7.9 (*)    Anion gap 4 (*)    All other components within normal limits  CBC WITH DIFFERENTIAL/PLATELET  TROPONIN I (HIGH SENSITIVITY)  TROPONIN I (HIGH SENSITIVITY)    EKG EKG Interpretation  Date/Time:  Thursday June 06 2022 19:52:07 EDT Ventricular Rate:  76 PR Interval:  166 QRS Duration: 86 QT Interval:  386 QTC Calculation: 434 R Axis:   88 Text Interpretation: Normal sinus rhythm with sinus arrhythmia Normal ECG When compared with ECG of 12-Jul-2020 06:41, Criteria for Septal infarct are no longer Present T wave inversion no longer evident in Anterior leads Confirmed by Vanetta Mulders [phone]) on 06/06/2022 8:01:13 PM  Radiology DG Chest Port 1 View  Result Date: 06/06/2022 CLINICAL DATA:  Chest pain. EXAM: PORTABLE CHEST 1 VIEW COMPARISON:  July 08, 2020 FINDINGS: The heart size and mediastinal contours are within normal limits. Both lungs are clear. The visualized skeletal structures are unremarkable. IMPRESSION: No active disease. Electronically Signed   By: Aram Candela M.D.   On: 06/06/2022 21:27    Procedures Procedures    Medications Ordered in ED Medications - No data to display  ED Course/ Medical Decision Making/ A&P                           Medical Decision Making Amount and/or Complexity of Data Reviewed Labs: ordered. Radiology: ordered.   Patient with known history of coronary artery disease.  Will need delta troponins.  First troponin less than 2 very reassuring.  CBC normal no leukocytosis hemoglobin 13.1 basic metabolic panel without any acute changes sugar up a little bit at 149.  Renal function is normal.  Portable chest x-ray no acute disease.  EKG without any acute changes.  Actually showed some  improvement from previous ones.  If delta troponin not significantly elevated or changed patient can be discharged home and follow-up with cardiology.  Patient is currently chest pain-free.  Second troponin also less than 2 no significant changes in the troponins.  Patient stable for discharge home close follow-up with cardiology.  Final Clinical Impression(s) / ED Diagnoses Final diagnoses:  Precordial pain    Rx / DC Orders ED Discharge Orders     None  Vanetta Mulders, MD 06/06/22 [id number]    Vanetta Mulders, MD 06/06/22 2256

## 2022-06-06 NOTE — ED Notes (Signed)
Pt care taken, thinks that his chest pain may have came from anxiety but he wanted to make sure

## 2022-06-06 NOTE — ED Triage Notes (Signed)
CP started today. Roommate did not pay light bill. Pt unsure if anxiety or heart attack. Hx of MI 2021

## 2022-08-11 NOTE — Progress Notes (Unsigned)
Cardiology Office Note  Date: 08/12/2022   ID: Billy Briggs, DOB [birth year], MRN [id number]  PCP:  [facility]  Cardiologist:  Nona DellSamuel Karine Garn, MD Electrophysiologist:  None   Chief Complaint  Patient presents with   Cardiac follow-up    History of Present Illness: Billy Briggs is a 56 y.o. male last seen in October 2022.  He is here for a routine visit.  Reports no interval chest pain and states that he has been compliant with his medications.  Working night shift at [employer].  Records indicate ER evaluation in August for chest pain.  ECG showed no acute ST segment changes and high-sensitivity troponin I levels were normal.  He was discharged with recommendation for outpatient cardiology follow-up.  He recalls that he had had an upsetting encounter with his roommate prompting symptoms.  I reviewed his medications which are noted below.  States that he has a fresh bottle of nitroglycerin.  Plan to recheck fasting lipid profile on Lipitor.  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, UDS positive for cocaine    Past Surgical History:  Procedure Laterality Date   CORONARY ANGIOGRAPHY N/A 07/11/2020   Procedure: CORONARY ANGIOGRAPHY;  Surgeon: Lennette BihariKelly, Thomas A, MD;  [facility];  Service: Cardiovascular;  Laterality: N/A;   CORONARY STENT INTERVENTION N/A 07/11/2020   Procedure: CORONARY STENT INTERVENTION;  Surgeon: Lennette BihariKelly, Thomas A, MD;  [facility]: [facility];  Service: Cardiovascular;  Laterality: N/A;   INTRAVASCULAR ULTRASOUND/IVUS N/A 07/11/2020   Procedure: Intravascular Ultrasound/IVUS;  Surgeon: Lennette BihariKelly, Thomas A, MD;  [facility];  Service: Cardiovascular;   Laterality: N/A;   LEFT HEART CATH AND CORONARY ANGIOGRAPHY N/A 07/09/2020   Procedure: LEFT HEART CATH AND CORONARY ANGIOGRAPHY;  Surgeon: Lennette BihariKelly, Thomas A, MD;  [facility]: [facility];  Service: Cardiovascular;  Laterality: N/A;    Current Outpatient Medications  Medication Sig Dispense Refill   aspirin EC 81 MG EC tablet Take 1 tablet (81 mg total) by mouth daily. Swallow whole. 30 tablet 11   atorvastatin (LIPITOR) 80 MG tablet Take 0.5 tablets (40 mg total) by mouth every evening. 45 tablet 3   carvedilol (COREG) 3.125 MG tablet Take 1 tablet (3.125 mg total) by mouth 2 (two) times daily with a meal. 180 tablet 3   nitroGLYCERIN (NITROSTAT) 0.4 MG SL tablet Place 1 tablet (0.4 mg total) under the tongue every 5 (five) minutes x 3 doses as needed for chest pain (up to 3 doses). 25 tablet 3   sildenafil (VIAGRA) 50 MG tablet Take 1 tablet (50 mg total) by mouth daily as needed for erectile dysfunction. 5 tablet 0   traZODone (DESYREL) 50 MG tablet Take 25-50 mg by mouth at bedtime.     No current facility-administered medications for this visit.   Allergies:  Patient has no known allergies.   ROS: Palpitations or syncope.  Physical Exam: VS:  BP 104/78   Pulse 72   Ht 6\' 1"  (1.854 m)   Wt 192 lb 12.8 oz (87.5 kg)   SpO2 98%   BMI 25.44 kg/m , BMI Body mass index is 25.44 kg/m.  Wt Readings from Last 3 Encounters:  08/12/22 192 lb 12.8 oz (87.5 kg)  06/06/22 190 lb (86.2 kg)  08/14/21 184 lb (83.5 kg)    General: Patient appears comfortable at rest. HEENT: Conjunctiva and lids normal. Neck: Supple, no elevated JVP or carotid bruits. Lungs: Clear to auscultation, nonlabored breathing at rest. Cardiac: Regular rate and rhythm, no S3 or significant systolic murmur, no pericardial rub. Abdomen: Soft, nontender, bowel sounds present. Extremities: No pitting edema.  ECG:  An ECG dated 06/07/2022 was personally reviewed today and demonstrated:  Sinus arrhythmia.  Recent  Labwork: 06/06/2022: BUN 15; Creatinine, Ser 0.98; Hemoglobin 13.1; Platelets 190; Potassium 3.5; Sodium 139     Component Value Date/Time   CHOL 80 12/08/2020 1158   TRIG 61 12/08/2020 1158   HDL 42 12/08/2020 1158   CHOLHDL 1.9 12/08/2020 1158   VLDL 20 07/09/2020 0751   LDLCALC 24 12/08/2020 1158    Other Studies Reviewed Today:  Echocardiogram 10/12/2020:  1. Left ventricular ejection fraction, by estimation, is 55 to 60%. The  left ventricle has normal function. The left ventricle has no regional  wall motion abnormalities. Left ventricular diastolic parameters were  normal. Normal global longitudinal  strain of -19.5%.   2. Right ventricular systolic function is normal. The right ventricular  size is normal. There is mildly elevated pulmonary artery systolic  pressure. The estimated right ventricular systolic pressure is 32.3 mmHg.   3. The mitral valve is grossly normal. Mild mitral valve regurgitation.   4. The aortic valve is tricuspid. Aortic valve regurgitation is not  visualized.   5. The inferior vena cava is normal in size with <50% respiratory  variability, suggesting right atrial pressure of 8 mmHg.  Chest x-ray 06/06/2022: FINDINGS: The heart size and mediastinal contours are within normal limits. Both lungs are clear. The visualized skeletal structures are unremarkable.   IMPRESSION: No active disease.  Assessment and Plan:  1.  CAD status post DES to the ostial/proximal LAD in September 2021.  He is clinically stable without angina at this time on medical therapy.  I reviewed his most recent ECG and lab work.  Continues on aspirin, Coreg, Lipitor, and as needed nitroglycerin.  2.  History of cardiomyopathy with normalization of LVEF, 55 to 60% by last assessment in 2021.  Reports NYHA class I dyspnea.  3.  Mixed hyperlipidemia, tolerating Lipitor 40 mg daily.  Recheck FLP.  Medication Adjustments/Labs and Tests Ordered: Current medicines are reviewed at  length with the patient today.  Concerns regarding medicines are outlined above.   Tests Ordered: Orders Placed This Encounter  Procedures   Lipid panel    Medication Changes: No orders of the defined types were placed in this encounter.   Disposition:  Follow up  1 year.  Signed, Satira Sark, MD, [facility] 08/12/2022 9:06 AM    [facility] [address], [place], [place] [address] Phone: [phone]; Fax: [phone]

## 2022-08-12 ENCOUNTER — Encounter: Payer: Self-pay | Admitting: Cardiology

## 2022-08-12 ENCOUNTER — Ambulatory Visit: Payer: BC Managed Care – PPO | Attending: Cardiology | Admitting: Cardiology

## 2022-08-12 VITALS — BP 104/78 | HR 72 | Ht 73.0 in | Wt 192.8 lb

## 2022-08-12 DIAGNOSIS — Z8679 Personal history of other diseases of the circulatory system: Secondary | ICD-10-CM

## 2022-08-12 DIAGNOSIS — E782 Mixed hyperlipidemia: Secondary | ICD-10-CM | POA: Diagnosis not present

## 2022-08-12 DIAGNOSIS — I25119 Atherosclerotic heart disease of native coronary artery with unspecified angina pectoris: Secondary | ICD-10-CM

## 2022-08-12 NOTE — Patient Instructions (Addendum)
Medication Instructions:  Your physician recommends that you continue on your current medications as directed. Please refer to the Current Medication list given to you today.   Labwork: Fasting Lipid Panel (within a week at [facility])  Testing/Procedures: none  Follow-Up:  Your physician recommends that you schedule a follow-up appointment in: 1 year follow up  Any Other Special Instructions Will Be Listed Below (If Applicable).  You will receive a call in about 10 months reminding you to schedule your appointment. If you do not receive this call, please contact our office.  If you need a refill on your cardiac medications before your next appointment, please call your pharmacy.

## 2022-08-23 ENCOUNTER — Encounter: Payer: Self-pay | Admitting: *Deleted

## 2022-09-12 LAB — LIPID PANEL
Chol/HDL Ratio: 2.6 ratio (ref 0.0–5.0)
Cholesterol, Total: 116 mg/dL (ref 100–199)
HDL: 45 mg/dL (ref >39)
LDL Chol Calc (NIH): 42 mg/dL (ref 0–99)
Triglycerides: 177 mg/dL — ABNORMAL HIGH (ref 0–149)
VLDL Cholesterol Cal: 29 mg/dL (ref 5–40)

## 2022-09-23 ENCOUNTER — Other Ambulatory Visit: Payer: Self-pay | Admitting: Internal Medicine

## 2022-09-23 DIAGNOSIS — M545 Low back pain, unspecified: Secondary | ICD-10-CM

## 2022-10-04 ENCOUNTER — Ambulatory Visit
Admission: RE | Admit: 2022-10-04 | Discharge: 2022-10-04 | Disposition: A | Discharge status: Home / Self Care | Payer: BC Managed Care – PPO | Source: Ambulatory Visit | Attending: Internal Medicine | Admitting: Internal Medicine

## 2022-10-04 DIAGNOSIS — M545 Low back pain, unspecified: Secondary | ICD-10-CM

## 2022-10-10 ENCOUNTER — Telehealth: Payer: Self-pay

## 2022-10-10 NOTE — Telephone Encounter (Signed)
   Name: Billy Briggs  DOB: 1965-12-07  MRN: [id number]   Primary Cardiologist: Nona Dell, MD  Chart reviewed as part of pre-operative protocol coverage. Patient was contacted 10/10/2022 in reference to pre-operative risk assessment for pending surgery as outlined below.  Billy Briggs was last seen on 08/12/2022 by Dr. Diona Browner.  Since that day, Billy Briggs has done a cardiac standpoint.  He denies any new symptoms or concerns.  He is able to complete greater than 4 METS without difficulty..  Therefore, based on ACC/AHA guidelines, the patient would be at acceptable risk for the planned procedure without further cardiovascular testing.   The patient was advised that if he develops new symptoms prior to surgery to contact our office to arrange for a follow-up visit, and he verbalized understanding.  Per office protocol,  he may hold Aspirin for 5-7 days prior to procedure. Please resume Aspirin as soon as possible postprocedure, at the discretion of the surgeon.    I will route this recommendation to the requesting party via Epic fax function and remove from pre-op pool. Please call with questions.  Joylene Grapes, NP 10/10/2022, 2:16 PM

## 2022-10-10 NOTE — Telephone Encounter (Signed)
..     Pre-operative Risk Assessment    Patient Name: Billy Briggs  DOB: 01-17-1966 MRN: [id number]      Request for Surgical Clearance    Procedure:   l5-s1 lumbar microdiskectomy  Date of Surgery:  Clearance 10/11/22                                 Surgeon:  dr Sherilyn Cooter pool Surgeon's Group or Practice Name:  [facility] Phone number:  [phone] Fax number:  [phone]   Type of Clearance Requested:   - Medical  - Pharmacy:  Hold Aspirin     Type of Anesthesia:  General    Additional requests/questions:    SignedRenee Ramus   10/10/2022, 2:00 PM

## 2022-10-21 ENCOUNTER — Other Ambulatory Visit: Payer: Self-pay | Admitting: Cardiology

## 2023-01-02 ENCOUNTER — Encounter: Payer: Self-pay | Admitting: Radiology

## 2023-03-19 ENCOUNTER — Telehealth: Payer: Self-pay | Admitting: Cardiology

## 2023-03-19 MED ORDER — CARVEDILOL 3.125 MG PO TABS: 3.1250 mg | ORAL_TABLET | Freq: Two times a day (BID) | ORAL | 2 refills | Status: DC

## 2023-03-19 NOTE — Telephone Encounter (Signed)
*  STAT* If patient is at the pharmacy, call can be transferred to refill team.   1. Which medications need to be refilled? (please list name of each medication and dose if known) carvedilol (COREG) 3.125 MG tablet   2. Which pharmacy/location (including street and city if local pharmacy) is medication to be sent to? [facility] - [place], [place] - [address]   3. Do they need a 30 day or 90 day supply? 90 day   Prescription is expired.

## 2023-09-08 ENCOUNTER — Ambulatory Visit: Payer: BC Managed Care – PPO | Admitting: Cardiology

## 2023-10-21 ENCOUNTER — Ambulatory Visit
Admission: EM | Admit: 2023-10-21 | Discharge: 2023-10-21 | Disposition: A | Discharge status: Home / Self Care | Payer: BC Managed Care – PPO | Attending: Nurse Practitioner | Admitting: Nurse Practitioner

## 2023-10-21 ENCOUNTER — Ambulatory Visit (INDEPENDENT_AMBULATORY_CARE_PROVIDER_SITE_OTHER): Payer: BC Managed Care – PPO

## 2023-10-21 DIAGNOSIS — M25512 Pain in left shoulder: Secondary | ICD-10-CM | POA: Diagnosis not present

## 2023-10-21 MED ORDER — CYCLOBENZAPRINE HCL 5 MG PO TABS: 5.0000 mg | ORAL_TABLET | Freq: Every evening | ORAL | 0 refills | Status: AC | PRN

## 2023-10-21 MED ORDER — DEXAMETHASONE SODIUM PHOSPHATE 10 MG/ML IJ SOLN
10.0000 mg | INTRAMUSCULAR | Status: AC
  Administered 2023-10-21: 10 mg via INTRAMUSCULAR

## 2023-10-21 MED ORDER — PREDNISONE 20 MG PO TABS: 40.0000 mg | ORAL_TABLET | Freq: Every day | ORAL | 0 refills | Status: AC

## 2023-10-21 NOTE — ED Provider Notes (Signed)
[facility]    CSN: [id number] Arrival date & time: 10/21/23  0913      History   Chief Complaint Chief Complaint  Patient presents with   Shoulder Pain    HPI Billy Briggs is a 57 y.o. male.   The history is provided by the patient.   Patient presents for complaints of left shoulder pain is been present for the past 3 weeks.  Patient states pain is in his entire shoulder.  He states that the pain radiates down into the left arm and causes him to have tingling.  He states that he also has decreased range of motion due to pain, and has intermittent pain in the left neck.  Patient denies any injury or trauma, but states that he does a lot of heavy lifting and repetitive work that involves the left upper extremity. Denies prior injury or trauma. He has not taken any medication for his symptoms.  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, UDS positive for cocaine    Patient Active Problem List   Diagnosis Date Noted   Tobacco abuse 07/12/2020   CAD in native artery 07/12/2020   AKI (acute kidney injury) (HCC) 07/12/2020   Cocaine abuse (HCC)    Ischemic cardiomyopathy    NSTEMI (non-ST elevated myocardial infarction) (HCC) 07/09/2020    Past Surgical History:  Procedure Laterality Date   CORONARY ANGIOGRAPHY N/A 07/11/2020   Procedure: CORONARY ANGIOGRAPHY;  Surgeon: Lennette Bihari, MD;  [facility];  Service: Cardiovascular;  Laterality: N/A;   CORONARY STENT INTERVENTION N/A 07/11/2020   Procedure: CORONARY STENT INTERVENTION;  Surgeon: Lennette Bihari, MD;  [facility]: [facility];  Service: Cardiovascular;  Laterality: N/A;   CORONARY ULTRASOUND/IVUS N/A 07/11/2020   Procedure:  Intravascular Ultrasound/IVUS;  Surgeon: Lennette Bihari, MD;  Location: [facility] IN[facility] CV [facility];  Service: Cardiovascular;  Laterality: N/A;   LEFT HEART CATH AND CORONARY ANGIOGRAPHY N/A 07/09/2020   Procedure: LEFT HEART CATH AND CORONARY ANGIOGRAPHY;  Surgeon: Lennette Bihari, MD;  [facility];  Service: Cardiovascular;  Laterality: N/A;       Home Medications    Prior to Admission medications   Medication Sig Start Date End Date Taking? Authorizing Provider  aspirin EC 81 MG EC tablet Take 1 tablet (81 mg total) by mouth daily. Swallow whole. 07/13/20  Yes Dunn, Dayna N, PA-C  atorvastatin (LIPITOR) 80 MG tablet TAKE 1/2 (ONE-HALF) TABLET BY MOUTH IN THE EVENING 10/21/22  Yes Jonelle Sidle, MD  carvedilol (COREG) 3.125 MG tablet Take 1 tablet (3.125 mg total) by mouth 2 (two) times daily with a meal. 03/19/23  Yes Jonelle Sidle, MD  sildenafil (VIAGRA) 50 MG tablet Take 1 tablet (50 mg total) by mouth daily as needed for erectile dysfunction. 12/21/20  Yes Netta Neat., NP  traZODone (DESYREL) 50 MG tablet Take 25-50 mg by mouth at bedtime. 07/24/21  Yes [provider]  nitroGLYCERIN (NITROSTAT) 0.4 MG SL tablet Place 1 tablet (0.4 mg total) under the tongue every 5 (five) minutes x 3 doses as needed for chest pain (up to 3 doses). 01/29/22   Jonelle Sidle, MD  Family History Family History  Problem Relation Age of Onset   Cancer Mother     Social History Social History   Tobacco Use   Smoking status: Some Days   Smokeless tobacco: Never  Vaping Use   Vaping status: Never Used  Substance Use Topics   Alcohol use: Yes    Comment: occ   Drug use: Not Currently    Types: "Crack" cocaine     Allergies   Patient has no known allergies.   Review of Systems Review of Systems Per HPI  Physical Exam Triage Vital Signs ED Triage Vitals  Encounter Vitals Group     BP 10/21/23 0920 118/77     Systolic BP Percentile --       Diastolic BP Percentile --      Pulse Rate 10/21/23 0920 70     Resp 10/21/23 0920 18     Temp 10/21/23 0920 97.8 F (36.6 C)     Temp Source 10/21/23 0920 Oral     SpO2 10/21/23 0920 98 %     Weight --      Height --      Head Circumference --      Peak Flow --      Pain Score 10/21/23 0922 10     Pain Loc --      Pain Education --      Exclude from Growth Chart --    No data found.  Updated Vital Signs BP 118/77 (BP Location: Left Arm)   Pulse 70   Temp 97.8 F (36.6 C) (Oral)   Resp 18   SpO2 98%   Visual Acuity Right Eye Distance:   Left Eye Distance:   Bilateral Distance:    Right Eye Near:   Left Eye Near:    Bilateral Near:     Physical Exam Vitals and nursing note reviewed.  Constitutional:      General: He is not in acute distress.    Appearance: Normal appearance.  HENT:     Head: Normocephalic.  Eyes:     Extraocular Movements: Extraocular movements intact.     Pupils: Pupils are equal, round, and reactive to light.  Pulmonary:     Effort: Pulmonary effort is normal.  Musculoskeletal:     Left shoulder: Tenderness present. No swelling or deformity. Decreased range of motion. Decreased strength (left hand). Normal pulse.     Left upper arm: Normal.     Cervical back: Normal range of motion.  Lymphadenopathy:     Cervical: No cervical adenopathy.  Skin:    General: Skin is warm and dry.  Neurological:     General: No focal deficit present.     Mental Status: He is alert and oriented to person, place, and time.  Psychiatric:        Mood and Affect: Mood normal.        Behavior: Behavior normal.      UC Treatments / Results  Labs (all labs ordered are listed, but only abnormal results are displayed) Labs Reviewed - No data to display  EKG   Radiology No results found.  Procedures Procedures (including critical care time)  Medications Ordered in UC Medications - No data to display  Initial Impression / Assessment and Plan / UC  Course  I have reviewed the triage vital signs and the nursing notes.  Pertinent labs & imaging results that were available during my care of the patient were reviewed by me and considered in  my medical decision making (see chart for details).  X-ray of the left shoulder is pending.  Suspect possible nerve impingement given the patient's complaints of tingling of the left upper extremity.  Decadron 10 mg IM administered.  Will start prednisone 40 mg for the next 5 days along with cyclobenzaprine 5 mg as a muscle relaxer.  Supportive care recommendations were provided and discussed with the patient to include over-the-counter Tylenol, gentle range of motion exercises, and the use of ice or heat.  Patient was advised that if symptoms do not improve with this treatment, recommend following up with orthopedics for further evaluation.  Referral for [facility] was placed.  Patient was in agreement with this plan of care and verbalized understanding.  All questions were answered.  Patient stable for discharge.  Work note was provided.  Final Clinical Impressions(s) / UC Diagnoses   Final diagnoses:  None   Discharge Instructions   None    ED Prescriptions   None    PDMP not reviewed this encounter.   Abran Cantor, NP 10/21/23 1003

## 2023-10-21 NOTE — ED Triage Notes (Signed)
Patients states he has left shoulder pain x 3 weeks. Pt states the pain is getting worse. Pt reports limited range of motion. Pt states he does move heavy wood at work.

## 2023-10-21 NOTE — Discharge Instructions (Addendum)
The x-ray is pending.  You will be contacted if the results of the x-ray are abnormal.  You also have access to the results via MyChart. You were given an injection of Decadron 10 mg today.  Start the prednisone on 10/22/2023. Take medication as prescribed.  While you are taking the prednisone, you need to take over-the-counter Tylenol for breakthrough pain or discomfort. Recommend the use of ice or heat.  Apply ice for pain or swelling, heat for spasm or stiffness.  Apply for 20 minutes, remove for 1 hour, repeat as needed. Gentle range of motion exercises to help improve joint mobility. As discussed, if symptoms do not improve with this treatment, a referral for [facility] is been placed.  You will need to call to schedule an appointment. Follow-up as needed.

## 2023-11-12 ENCOUNTER — Encounter: Payer: Self-pay | Admitting: *Deleted

## 2023-11-12 ENCOUNTER — Encounter: Payer: Self-pay | Admitting: Cardiology

## 2023-11-12 ENCOUNTER — Ambulatory Visit: Payer: BC Managed Care – PPO | Attending: Cardiology | Admitting: Cardiology

## 2023-11-12 VITALS — BP 108/78 | HR 62 | Ht 73.0 in | Wt 199.6 lb

## 2023-11-12 DIAGNOSIS — I25119 Atherosclerotic heart disease of native coronary artery with unspecified angina pectoris: Secondary | ICD-10-CM

## 2023-11-12 DIAGNOSIS — I5032 Chronic diastolic (congestive) heart failure: Secondary | ICD-10-CM | POA: Diagnosis not present

## 2023-11-12 DIAGNOSIS — E782 Mixed hyperlipidemia: Secondary | ICD-10-CM

## 2023-11-12 MED ORDER — FISH OIL 1000 MG PO CAPS: 1000.0000 mg | ORAL_CAPSULE | Freq: Two times a day (BID) | ORAL | Status: AC

## 2023-11-12 NOTE — Progress Notes (Signed)
    Cardiology Office Note  Date: 11/12/2023   ID: Eligio Angert, DOB 1966/06/06, MRN [id number]  History of Present Illness: Billy Briggs is a 58 y.o. male last seen in October 2023.  He is here for a follow-up visit.  Reports no angina or nitroglycerin  use, stable NYHA class I dyspnea, no palpitations or syncope.  He is working the night shift at [employer], runs a [employer].  He states that he has had some paresthesias in his left arm, also left shoulder pain, this is worse with certain motions and positions of his left arm and improved with course of steroids.  Plans to seek orthopedic consultation.  The symptoms are not consistent with angina.  I reviewed his medications.  Current cardiovascular regimen includes aspirin , Coreg , Lipitor , and as needed nitroglycerin .  Recent lab work in December 2024 showed LDL 41, triglycerides up to 419.  We discussed this today, recommended omega-3 supplements 1000 mg twice daily OTC for now, also watching carbohydrates and sugars in the diet.  I reviewed his ECG today which shows normal sinus rhythm.  Physical Exam: VS:  BP 108/78 (BP Location: Left Arm, Cuff Size: Normal)   Pulse 62   Ht 6' 1 (1.854 m)   Wt 199 lb 9.6 oz (90.5 kg)   SpO2 100%   BMI 26.33 kg/m , BMI Body mass index is 26.33 kg/m.  Wt Readings from Last 3 Encounters:  11/12/23 199 lb 9.6 oz (90.5 kg)  08/12/22 192 lb 12.8 oz (87.5 kg)  06/06/22 190 lb (86.2 kg)    General: Patient appears comfortable at rest. HEENT: Conjunctiva and lids normal. Neck: Supple, no elevated JVP or carotid bruits. Lungs: Clear to auscultation, nonlabored breathing at rest. Cardiac: Regular rate and rhythm, no S3 or significant systolic murmur. Extremities: No pitting edema.  ECG:  An ECG dated 06/07/2022 was personally reviewed today and demonstrated:  Sinus arrhythmia.  Labwork:  November 2023: Cholesterol 116, triglycerides 177, HDL 45, LDL 42 December 2024: BUN 20, creatinine 1.0, potassium  4.0, AST 23, ALT 30, cholesterol 153, triglycerides 419, HDL 51, LDL 41, TSH 2.06  Other Studies Reviewed Today:  No interval cardiac testing for review today.  Assessment and Plan:  1.  CAD status post DES to the ostial/proximal LAD in September 2021 (UDS positive for cocaine at that time, he denies any subsequent use).  He does not report any angina or nitroglycerin  use, stable NYHA class I dyspnea.  ECG is normal today.  Continue aspirin  and Lipitor .  2.  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  He remains on Coreg , NYHA class I dyspnea and no fluid retention.  Continue observation at this point.  3.  Mixed hyperlipidemia.  LDL 41 and triglycerides 419 in December 2024.  He is on Lipitor .  Recommended omega-3 supplements 1000 mg twice daily, also limiting carbohydrates/simple sugars in the diet.  4.  Erectile dysfunction, on sildenafil  with follow-up by Dr. Trudy at [facility].  No cardiac contraindication to refill this medication at this point.  Disposition:  Follow up  1 year, sooner if needed.  Signed, Jayson JUDITHANN Sierras, M.D., F.A.C.C. [facility]

## 2023-11-12 NOTE — Patient Instructions (Addendum)
 Medication Instructions:  Your physician has recommended you make the following change in your medication:  Start fish oil  1000 mg twice daily Continue all other medications as prescribed  Labwork: none  Testing/Procedures: none  Follow-Up: Your physician recommends that you schedule a follow-up appointment in: 1 year. You will receive a reminder call in about 10 months reminding you to schedule your appointment. If you don't receive this call, please contact our office.  Any Other Special Instructions Will Be Listed Below (If Applicable).  If you need a refill on your cardiac medications before your next appointment, please call your pharmacy.

## 2023-12-26 ENCOUNTER — Other Ambulatory Visit: Payer: Self-pay | Admitting: Cardiology
